# Patient Record
Sex: Female | Born: 1990 | Race: White | Hispanic: No | Marital: Married | Smoking: Former smoker
Health system: Southern US, Community
[De-identification: ages and names within clinical notes are randomized; demographics above are authoritative.]

## PROBLEM LIST (undated history)

## (undated) DIAGNOSIS — R42 Dizziness and giddiness: Secondary | ICD-10-CM

## (undated) DIAGNOSIS — J45909 Unspecified asthma, uncomplicated: Secondary | ICD-10-CM

## (undated) DIAGNOSIS — IMO0002 Reserved for concepts with insufficient information to code with codable children: Secondary | ICD-10-CM

## (undated) DIAGNOSIS — F32A Depression, unspecified: Secondary | ICD-10-CM

## (undated) DIAGNOSIS — G9050 Complex regional pain syndrome I, unspecified: Secondary | ICD-10-CM

## (undated) DIAGNOSIS — N83209 Unspecified ovarian cyst, unspecified side: Secondary | ICD-10-CM

## (undated) DIAGNOSIS — F419 Anxiety disorder, unspecified: Secondary | ICD-10-CM

## (undated) DIAGNOSIS — D649 Anemia, unspecified: Secondary | ICD-10-CM

## (undated) DIAGNOSIS — F319 Bipolar disorder, unspecified: Secondary | ICD-10-CM

## (undated) DIAGNOSIS — F329 Major depressive disorder, single episode, unspecified: Secondary | ICD-10-CM

## (undated) HISTORY — DX: Major depressive disorder, single episode, unspecified: F32.9

## (undated) HISTORY — DX: Depression, unspecified: F32.A

## (undated) HISTORY — PX: CHOLECYSTECTOMY: SHX55

## (undated) HISTORY — DX: Anxiety disorder, unspecified: F41.9

---

## 2012-10-26 ENCOUNTER — Emergency Department (HOSPITAL_BASED_OUTPATIENT_CLINIC_OR_DEPARTMENT_OTHER)
Admission: EM | Admit: 2012-10-26 | Discharge: 2012-10-26 | Disposition: A | Discharge status: Home / Self Care | Payer: 59 | Attending: Emergency Medicine | Admitting: Emergency Medicine

## 2012-10-26 ENCOUNTER — Encounter (HOSPITAL_BASED_OUTPATIENT_CLINIC_OR_DEPARTMENT_OTHER): Payer: Self-pay | Admitting: *Deleted

## 2012-10-26 ENCOUNTER — Emergency Department (HOSPITAL_BASED_OUTPATIENT_CLINIC_OR_DEPARTMENT_OTHER): Payer: 59

## 2012-10-26 DIAGNOSIS — R197 Diarrhea, unspecified: Secondary | ICD-10-CM | POA: Insufficient documentation

## 2012-10-26 DIAGNOSIS — J45909 Unspecified asthma, uncomplicated: Secondary | ICD-10-CM | POA: Insufficient documentation

## 2012-10-26 DIAGNOSIS — Z8742 Personal history of other diseases of the female genital tract: Secondary | ICD-10-CM | POA: Insufficient documentation

## 2012-10-26 DIAGNOSIS — F17200 Nicotine dependence, unspecified, uncomplicated: Secondary | ICD-10-CM | POA: Insufficient documentation

## 2012-10-26 DIAGNOSIS — Z79899 Other long term (current) drug therapy: Secondary | ICD-10-CM | POA: Insufficient documentation

## 2012-10-26 DIAGNOSIS — F319 Bipolar disorder, unspecified: Secondary | ICD-10-CM | POA: Insufficient documentation

## 2012-10-26 DIAGNOSIS — N949 Unspecified condition associated with female genital organs and menstrual cycle: Secondary | ICD-10-CM | POA: Insufficient documentation

## 2012-10-26 DIAGNOSIS — Z3202 Encounter for pregnancy test, result negative: Secondary | ICD-10-CM | POA: Insufficient documentation

## 2012-10-26 DIAGNOSIS — Z8669 Personal history of other diseases of the nervous system and sense organs: Secondary | ICD-10-CM | POA: Insufficient documentation

## 2012-10-26 DIAGNOSIS — R110 Nausea: Secondary | ICD-10-CM | POA: Insufficient documentation

## 2012-10-26 HISTORY — DX: Unspecified asthma, uncomplicated: J45.909

## 2012-10-26 HISTORY — DX: Bipolar disorder, unspecified: F31.9

## 2012-10-26 HISTORY — DX: Unspecified ovarian cyst, unspecified side: N83.209

## 2012-10-26 HISTORY — DX: Complex regional pain syndrome I, unspecified: G90.50

## 2012-10-26 LAB — WET PREP, GENITAL: Trich, Wet Prep: NONE SEEN

## 2012-10-26 LAB — URINALYSIS, ROUTINE W REFLEX MICROSCOPIC
Bilirubin Urine: NEGATIVE
Hgb urine dipstick: NEGATIVE
Ketones, ur: NEGATIVE mg/dL
Nitrite: NEGATIVE
pH: 7 (ref 5.0–8.0)

## 2012-10-26 MED ORDER — ONDANSETRON 4 MG PO TBDP: 4.0000 mg | ORAL_TABLET | Freq: Three times a day (TID) | ORAL | Status: DC | PRN

## 2012-10-26 MED ORDER — HYDROCODONE-ACETAMINOPHEN 5-325 MG PO TABS: 2.0000 | ORAL_TABLET | Freq: Four times a day (QID) | ORAL | Status: DC | PRN

## 2012-10-26 MED ORDER — ONDANSETRON 4 MG PO TBDP
4.0000 mg | ORAL_TABLET | Freq: Once | ORAL | Status: AC
  Administered 2012-10-26: 4 mg via ORAL
  Filled 2012-10-26: qty 1

## 2012-10-26 MED ORDER — HYDROCODONE-ACETAMINOPHEN 5-325 MG PO TABS
2.0000 | ORAL_TABLET | Freq: Once | ORAL | Status: AC
  Administered 2012-10-26: 2 via ORAL
  Filled 2012-10-26: qty 2

## 2012-10-26 NOTE — ED Notes (Signed)
Assisted EDP with pelvic exam 

## 2012-10-26 NOTE — ED Notes (Signed)
Pt c/o abd pain x 1 week- n/v/d x 1 week

## 2012-10-26 NOTE — ED Notes (Signed)
D/c home with ride- rx x 2 given for zofran and hydrocodone

## 2012-10-26 NOTE — ED Provider Notes (Signed)
History     CSN: [id number]  Arrival date & time 10/26/12  2137   First MD Initiated Contact with Patient 10/26/12 2152      Chief Complaint  Patient presents with  . Abdominal Pain    (Consider location/radiation/quality/duration/timing/severity/associated sxs/prior treatment) Patient is a 22 y.o. female presenting with abdominal pain.  Abdominal Pain  Pt with history of recurrent ovarian cysts reports about a week of severe aching R pelvic pain, worse with movement, associated with nausea and diarrhea. Denies dysuria, hematuria, vaginal bleeding or vaginal discharge. Does not think she is pregnant. Moved her to live with mother recently.   Past Medical History  Diagnosis Date  . Asthma   . RSD (reflex sympathetic dystrophy)   . Ovarian cyst   . Bipolar 1 disorder     Past Surgical History  Procedure Laterality Date  . Cholecystectomy      No family history on file.  History  Substance Use Topics  . Smoking status: Current Some Day Smoker  . Smokeless tobacco: Never Used  . Alcohol Use: No    OB History   Grav Para Term Preterm Abortions TAB SAB Ect Mult Living                  Review of Systems  Gastrointestinal: Positive for abdominal pain.   All other systems reviewed and are negative except as noted in HPI.   Allergies  Dilaudid; Percocet; and Subutex  Home Medications   Current Outpatient Rx  Name  Route  Sig  Dispense  Refill  . QUEtiapine Fumarate (SEROQUEL XR PO)   Oral   Take by mouth.           BP 130/75  Pulse 88  Temp(Src) 98.4 F (36.9 C)  Resp 18  SpO2 100%  LMP 10/06/2012  Physical Exam  Nursing note and vitals reviewed. Constitutional: She is oriented to person, place, and time. She appears well-developed and well-nourished.  HENT:  Head: Normocephalic and atraumatic.  Eyes: EOM are normal. Pupils are equal, round, and reactive to light.  Neck: Normal range of motion. Neck supple.  Cardiovascular: Normal rate, normal  heart sounds and intact distal pulses.   Pulmonary/Chest: Effort normal and breath sounds normal.  Abdominal: Bowel sounds are normal. She exhibits no distension. There is tenderness (R pelvis, no focal tenderness at McBurney's point). There is no rebound and no guarding.  Genitourinary:  No discharge, no bleeding, normal cervix without CMT, tenderness over R adnexa, no masses  Musculoskeletal: Normal range of motion. She exhibits no edema and no tenderness.  Neurological: She is alert and oriented to person, place, and time. She has normal strength. No cranial nerve deficit or sensory deficit.  Skin: Skin is warm and dry. No rash noted.  Psychiatric: She has a normal mood and affect.    ED Course  Procedures (including critical care time)  Labs Reviewed  WET PREP, GENITAL - Abnormal; Notable for the following:    Clue Cells Wet Prep HPF POC FEW (*)    WBC, Wet Prep HPF POC FEW (*)    All other components within normal limits  GC/CHLAMYDIA PROBE AMP  URINALYSIS, ROUTINE W REFLEX MICROSCOPIC  PREGNANCY, URINE   US Transvaginal Non-ob  10/26/2012  *RADIOLOGY REPORT*  Clinical Data:  Right lower quadrant and pelvic pain for 1 week with nausea, vomiting, diarrhea, past history ovarian cysts  TRANSABDOMINAL AND TRANSVAGINAL ULTRASOUND OF PELVIS DOPPLER ULTRASOUND OF OVARIES  Technique:  Both transabdominal and  transvaginal ultrasound examinations of the pelvis were performed. Transabdominal technique was performed for global imaging of the pelvis including uterus, ovaries, adnexal regions, and pelvic cul-de-sac. Transabdominal imaging limited by inadequate bladder distention and poor acoustic window as well as presence of numerous bowel loops.  It was necessary to proceed with endovaginal exam following the transabdominal exam to visualize the the endometrium, ovaries and adnexae.  Color and duplex Doppler ultrasound was utilized to evaluate blood flow to the ovaries.  Comparison:  None  Findings:   Uterus:  With 6.3 cm length by 4.4 cm AP by 5.1 cm transverse. Normal morphology without mass.  Endometrium:  9 mm thick, normal.  No endometrial fluid.  Right ovary: 3.6 x 2.9 x 3.0 cm.Dominant follicle.  No additional masses.  Internal blood flow present on color Doppler imaging.  Left ovary:   2.4 x 1.5 1.0 cm.  Normal morphology without mass. Internal blood flow present on color Doppler imaging.  Pulsed Doppler evaluation demonstrates normal low-resistance arterial and venous waveforms in both ovaries.  IMPRESSION: Normal exam. No evidence of pelvic mass or other significant abnormality. No sonographic evidence for ovarian torsion.   Original Report Authenticated By: Ulyses Southward, M.D.    US Pelvis Complete  10/26/2012  *RADIOLOGY REPORT*  Clinical Data:  Right lower quadrant and pelvic pain for 1 week with nausea, vomiting, diarrhea, past history ovarian cysts  TRANSABDOMINAL AND TRANSVAGINAL ULTRASOUND OF PELVIS DOPPLER ULTRASOUND OF OVARIES  Technique:  Both transabdominal and transvaginal ultrasound examinations of the pelvis were performed. Transabdominal technique was performed for global imaging of the pelvis including uterus, ovaries, adnexal regions, and pelvic cul-de-sac. Transabdominal imaging limited by inadequate bladder distention and poor acoustic window as well as presence of numerous bowel loops.  It was necessary to proceed with endovaginal exam following the transabdominal exam to visualize the the endometrium, ovaries and adnexae.  Color and duplex Doppler ultrasound was utilized to evaluate blood flow to the ovaries.  Comparison:  None  Findings:  Uterus:  With 6.3 cm length by 4.4 cm AP by 5.1 cm transverse. Normal morphology without mass.  Endometrium:  9 mm thick, normal.  No endometrial fluid.  Right ovary: 3.6 x 2.9 x 3.0 cm.Dominant follicle.  No additional masses.  Internal blood flow present on color Doppler imaging.  Left ovary:   2.4 x 1.5 1.0 cm.  Normal morphology without mass.  Internal blood flow present on color Doppler imaging.  Pulsed Doppler evaluation demonstrates normal low-resistance arterial and venous waveforms in both ovaries.  IMPRESSION: Normal exam. No evidence of pelvic mass or other significant abnormality. No sonographic evidence for ovarian torsion.   Original Report Authenticated By: Ulyses Southward, M.D.    [place] Art/ven Flow Abd Pelv Doppler  10/26/2012  *RADIOLOGY REPORT*  Clinical Data:  Right lower quadrant and pelvic pain for 1 week with nausea, vomiting, diarrhea, past history ovarian cysts  TRANSABDOMINAL AND TRANSVAGINAL ULTRASOUND OF PELVIS DOPPLER ULTRASOUND OF OVARIES  Technique:  Both transabdominal and transvaginal ultrasound examinations of the pelvis were performed. Transabdominal technique was performed for global imaging of the pelvis including uterus, ovaries, adnexal regions, and pelvic cul-de-sac. Transabdominal imaging limited by inadequate bladder distention and poor acoustic window as well as presence of numerous bowel loops.  It was necessary to proceed with endovaginal exam following the transabdominal exam to visualize the the endometrium, ovaries and adnexae.  Color and duplex Doppler ultrasound was utilized to evaluate blood flow to the ovaries.  Comparison:  None  Findings:  Uterus:  With 6.3 cm length by 4.4 cm AP by 5.1 cm transverse. Normal morphology without mass.  Endometrium:  9 mm thick, normal.  No endometrial fluid.  Right ovary: 3.6 x 2.9 x 3.0 cm.Dominant follicle.  No additional masses.  Internal blood flow present on color Doppler imaging.  Left ovary:   2.4 x 1.5 1.0 cm.  Normal morphology without mass. Internal blood flow present on color Doppler imaging.  Pulsed Doppler evaluation demonstrates normal low-resistance arterial and venous waveforms in both ovaries.  IMPRESSION: Normal exam. No evidence of pelvic mass or other significant abnormality. No sonographic evidence for ovarian torsion.   Original Report Authenticated By: Ulyses Southward, M.D.      No diagnosis found.    MDM  [place] as above neg for cyst, torsion or fluid collection. Pt with pelvic pain, no concern for acute appendicitis given location of pain and duration of symptoms. Advised pain and nausea meds as needed and close Gyn followup for recheck if not improved.         Charles B. Bernette Mayers, MD 10/26/12 2322

## 2012-10-27 LAB — GC/CHLAMYDIA PROBE AMP
CT Probe RNA: NEGATIVE
GC Probe RNA: NEGATIVE

## 2012-11-11 ENCOUNTER — Encounter: Payer: Self-pay | Admitting: Medical

## 2012-11-11 ENCOUNTER — Ambulatory Visit (INDEPENDENT_AMBULATORY_CARE_PROVIDER_SITE_OTHER): Payer: 59 | Admitting: Medical

## 2012-11-11 VITALS — BP 123/85 | HR 85 | Ht 67.0 in | Wt 131.5 lb

## 2012-11-11 DIAGNOSIS — N760 Acute vaginitis: Secondary | ICD-10-CM

## 2012-11-11 DIAGNOSIS — R112 Nausea with vomiting, unspecified: Secondary | ICD-10-CM

## 2012-11-11 DIAGNOSIS — A499 Bacterial infection, unspecified: Secondary | ICD-10-CM

## 2012-11-11 DIAGNOSIS — N946 Dysmenorrhea, unspecified: Secondary | ICD-10-CM

## 2012-11-11 DIAGNOSIS — J45909 Unspecified asthma, uncomplicated: Secondary | ICD-10-CM

## 2012-11-11 MED ORDER — ALBUTEROL SULFATE HFA 108 (90 BASE) MCG/ACT IN AERS: 2.0000 | INHALATION_SPRAY | Freq: Four times a day (QID) | RESPIRATORY_TRACT | Status: DC | PRN

## 2012-11-11 MED ORDER — METRONIDAZOLE 500 MG PO TABS: 500.0000 mg | ORAL_TABLET | Freq: Two times a day (BID) | ORAL | Status: DC

## 2012-11-11 MED ORDER — ONDANSETRON 4 MG PO TBDP: 4.0000 mg | ORAL_TABLET | Freq: Three times a day (TID) | ORAL | Status: DC | PRN

## 2012-11-11 NOTE — Progress Notes (Signed)
Patient ID: Elizabeth Little, female   DOB: 1991/05/26, 22 y.o.   MRN: [id number]  History:  Ms. Elizabeth Little  is a 22 y.o. G0P0000 who presents to clinic today for ED follow-up for lower abdominal pain. RLQ pain x 3+ weeks. N/V/D for the same period of time. History of ovarian cysts. LMP 10/28/12. Pain has been the same since ED visit. No solid foods. Ran out of nausea medicine over one week ago. Was working when she was taking it. History of IBS. Does not have PCP or GI here yet. Also has asthma and needs Rx for albuterol to last until PCP is established. History of dysmenorrhea. Not currently on any type of birth control. Had Mirena that had to be removed for malplacement surgically. Has also had cholecystectomy. Patient is sexually active and uses condoms.   The following portions of the patient's history were reviewed and updated as appropriate: allergies, current medications, past family history, past medical history, past social history, past surgical history and problem list.  Review of Systems:  Pertinent items are noted in HPI.  Objective:  Physical Exam BP 123/85  Pulse 85  Ht 5\' 7"  (1.702 m)  Wt 131 lb 8 oz (59.648 kg)  BMI 20.59 kg/m2  LMP 11/03/2012 GENERAL: Well-developed, well-nourished female in no acute distress.  HEENT: Normocephalic, atraumatic.  LUNGS: Normal rate. Clear to auscultation bilaterally.  HEART: Regular rate and rhythm with no adventitious sounds.  ABDOMEN: Soft, mild tenderness to palpation of the suprapubic region at midline and just right of midline, nondistended. No organomegaly. Normal bowel sounds appreciated in all quadrants.  EXTREMITIES: No cyanosis, clubbing, or edema.   Labs and Imaging US Transvaginal Non-ob  10/26/2012  *RADIOLOGY REPORT*  Clinical Data:  Right lower quadrant and pelvic pain for 1 week with nausea, vomiting, diarrhea, past history ovarian cysts  TRANSABDOMINAL AND TRANSVAGINAL ULTRASOUND OF PELVIS DOPPLER ULTRASOUND OF OVARIES   Technique:  Both transabdominal and transvaginal ultrasound examinations of the pelvis were performed. Transabdominal technique was performed for global imaging of the pelvis including uterus, ovaries, adnexal regions, and pelvic cul-de-sac. Transabdominal imaging limited by inadequate bladder distention and poor acoustic window as well as presence of numerous bowel loops.  It was necessary to proceed with endovaginal exam following the transabdominal exam to visualize the the endometrium, ovaries and adnexae.  Color and duplex Doppler ultrasound was utilized to evaluate blood flow to the ovaries.  Comparison:  None  Findings:  Uterus:  With 6.3 cm length by 4.4 cm AP by 5.1 cm transverse. Normal morphology without mass.  Endometrium:  9 mm thick, normal.  No endometrial fluid.  Right ovary: 3.6 x 2.9 x 3.0 cm.Dominant follicle.  No additional masses.  Internal blood flow present on color Doppler imaging.  Left ovary:   2.4 x 1.5 1.0 cm.  Normal morphology without mass. Internal blood flow present on color Doppler imaging.  Pulsed Doppler evaluation demonstrates normal low-resistance arterial and venous waveforms in both ovaries.  IMPRESSION: Normal exam. No evidence of pelvic mass or other significant abnormality. No sonographic evidence for ovarian torsion.   Original Report Authenticated By: Ulyses Southward, M.D.    10/26/12 - GC/Chlamydia negative 10/26/12 - Few clue cells seen on wet prep - untreated at time  Assessment & Plan:  Assessment: 1. Asthma, chronic 2. Dysmenorrhea 3. Bacterial vaginosis 4. Birth control counseling  Plans: Rx refill x 1 for albuterol, until patient is able to establish care with PCP Rx for Flagyl and Zofran sent to  patient's pharmacy PCP referral today from [facility]. Patient may need GI consult if symptoms persist.  All methods of birth control discussed. Patient interested in Nexplanon vs. Depo Provera. Information on both given on AVS. Patient will contact the office when  she has decided which method she prefers Patient needs annual exam at next visit or following Nexplanon insertion

## 2012-11-11 NOTE — Patient Instructions (Signed)
Etonogestrel implant What is this medicine? ETONOGESTREL is a contraceptive (birth control) device. It is used to prevent pregnancy. It can be used for up to 3 years. This medicine may be used for other purposes; ask your health care provider or pharmacist if you have questions. What should I tell my health care provider before I take this medicine? They need to know if you have any of these conditions: -abnormal vaginal bleeding -blood vessel disease or blood clots -cancer of the breast, cervix, or liver -depression -diabetes -gallbladder disease -headaches -heart disease or recent heart attack -high blood pressure -high cholesterol -kidney disease -liver disease -renal disease -seizures -tobacco smoker -an unusual or allergic reaction to etonogestrel, other hormones, anesthetics or antiseptics, medicines, foods, dyes, or preservatives -pregnant or trying to get pregnant -breast-feeding How should I use this medicine? This device is inserted just under the skin on the inner side of your upper arm by a health care professional. Talk to your pediatrician regarding the use of this medicine in children. Special care may be needed. Overdosage: If you think you've taken too much of this medicine contact a poison control center or emergency room at once. Overdosage: If you think you have taken too much of this medicine contact a poison control center or emergency room at once. NOTE: This medicine is only for you. Do not share this medicine with others. What if I miss a dose? This does not apply. What may interact with this medicine? Do not take this medicine with any of the following medications: -amprenavir -bosentan -fosamprenavir This medicine may also interact with the following medications: -barbiturate medicines for inducing sleep or treating seizures -certain medicines for fungal infections like ketoconazole and itraconazole -griseofulvin -medicines to treat seizures like  carbamazepine, felbamate, oxcarbazepine, phenytoin, topiramate -modafinil -phenylbutazone -rifampin -some medicines to treat HIV infection like atazanavir, indinavir, lopinavir, nelfinavir, tipranavir, ritonavir -St. John's wort This list may not describe all possible interactions. Give your health care provider a list of all the medicines, herbs, non-prescription drugs, or dietary supplements you use. Also tell them if you smoke, drink alcohol, or use illegal drugs. Some items may interact with your medicine. What should I watch for while using this medicine? This product does not protect you against HIV infection (AIDS) or other sexually transmitted diseases. You should be able to feel the implant by pressing your fingertips over the skin where it was inserted. Tell your doctor if you cannot feel the implant. What side effects may I notice from receiving this medicine? Side effects that you should report to your doctor or health care professional as soon as possible: -allergic reactions like skin rash, itching or hives, swelling of the face, lips, or tongue -breast lumps -changes in vision -confusion, trouble speaking or understanding -dark urine -depressed mood -general ill feeling or flu-like symptoms -light-colored stools -loss of appetite, nausea -right upper belly pain -severe headaches -severe pain, swelling, or tenderness in the abdomen -shortness of breath, chest pain, swelling in a leg -signs of pregnancy -sudden numbness or weakness of the face, arm or leg -trouble walking, dizziness, loss of balance or coordination -unusual vaginal bleeding, discharge -unusually weak or tired -yellowing of the eyes or skin Side effects that usually do not require medical attention (Report these to your doctor or health care professional if they continue or are bothersome.): -acne -breast pain -changes in weight -cough -fever or chills -headache -irregular menstrual  bleeding -itching, burning, and vaginal discharge -pain or difficulty passing urine -sore throat This   list may not describe all possible side effects. Call your doctor for medical advice about side effects. You may report side effects to FDA at 1-800-FDA-1088. Where should I keep my medicine? This drug is given in a hospital or clinic and will not be stored at home. NOTE: This sheet is a summary. It may not cover all possible information. If you have questions about this medicine, talk to your doctor, pharmacist, or health care provider.  2013, [employer]vier/Gold Standard. (04/27/2009 3:54:17 PM)  Medroxyprogesterone injection [Contraceptive] What is this medicine? MEDROXYPROGESTERONE (me DROX ee proe JES te rone) contraceptive injections prevent pregnancy. They provide effective birth control for 3 months. Depo-subQ Provera 104 is also used for treating pain related to endometriosis. This medicine may be used for other purposes; ask your health care provider or pharmacist if you have questions. What should I tell my health care provider before I take this medicine? They need to know if you have any of these conditions: -frequently drink alcohol -asthma -blood vessel disease or a history of a blood clot in the lungs or legs -bone disease such as osteoporosis -breast cancer -diabetes -eating disorder (anorexia nervosa or bulimia) -high blood pressure -HIV infection or AIDS -kidney disease -liver disease -mental depression -migraine -seizures (convulsions) -stroke -tobacco smoker -vaginal bleeding -an unusual or allergic reaction to medroxyprogesterone, other hormones, medicines, foods, dyes, or preservatives -pregnant or trying to get pregnant -breast-feeding How should I use this medicine? Depo-Provera Contraceptive injection is given into a muscle. Depo-subQ Provera 104 injection is given under the skin. These injections are given by a health care professional. You must not be  pregnant before getting an injection. The injection is usually given during the first 5 days after the start of a menstrual period or 6 weeks after delivery of a baby. Talk to your pediatrician regarding the use of this medicine in children. Special care may be needed. These injections have been used in female children who have started having menstrual periods. Overdosage: If you think you have taken too much of this medicine contact a poison control center or emergency room at once. NOTE: This medicine is only for you. Do not share this medicine with others. What if I miss a dose? Try not to miss a dose. You must get an injection once every 3 months to maintain birth control. If you cannot keep an appointment, call and reschedule it. If you wait longer than 13 weeks between Depo-Provera contraceptive injections or longer than 14 weeks between Depo-subQ Provera 104 injections, you could get pregnant. Use another method for birth control if you miss your appointment. You may also need a pregnancy test before receiving another injection. What may interact with this medicine? Do not take this medicine with any of the following medications: -bosentan This medicine may also interact with the following medications: -aminoglutethimide -antibiotics or medicines for infections, especially rifampin, rifabutin, rifapentine, and griseofulvin -aprepitant -barbiturate medicines such as phenobarbital or primidone -bexarotene -carbamazepine -medicines for seizures like ethotoin, felbamate, oxcarbazepine, phenytoin, topiramate -modafinil -St. John's wort This list may not describe all possible interactions. Give your health care provider a list of all the medicines, herbs, non-prescription drugs, or dietary supplements you use. Also tell them if you smoke, drink alcohol, or use illegal drugs. Some items may interact with your medicine. What should I watch for while using this medicine? This drug does not protect  you against HIV infection (AIDS) or other sexually transmitted diseases. Use of this product may cause you to lose  calcium from your bones. Loss of calcium may cause weak bones (osteoporosis). Only use this product for more than 2 years if other forms of birth control are not right for you. The longer you use this product for birth control the more likely you will be at risk for weak bones. Ask your health care professional how you can keep strong bones. You may have a change in bleeding pattern or irregular periods. Many females stop having periods while taking this drug. If you have received your injections on time, your chance of being pregnant is very low. If you think you may be pregnant, see your health care professional as soon as possible. Tell your health care professional if you want to get pregnant within the next year. The effect of this medicine may last a long time after you get your last injection. What side effects may I notice from receiving this medicine? Side effects that you should report to your doctor or health care professional as soon as possible: -allergic reactions like skin rash, itching or hives, swelling of the face, lips, or tongue -breast tenderness or discharge -breathing problems -changes in vision -depression -feeling faint or lightheaded, falls -fever -pain in the abdomen, chest, groin, or leg -problems with balance, talking, walking -unusually weak or tired -yellowing of the eyes or skin Side effects that usually do not require medical attention (report to your doctor or health care professional if they continue or are bothersome): -acne -fluid retention and swelling -headache -irregular periods, spotting, or absent periods -temporary pain, itching, or skin reaction at site where injected -weight gain This list may not describe all possible side effects. Call your doctor for medical advice about side effects. You may report side effects to FDA at  1-800-FDA-1088. Where should I keep my medicine? This does not apply. The injection will be given to you by a health care professional. NOTE: This sheet is a summary. It may not cover all possible information. If you have questions about this medicine, talk to your doctor, pharmacist, or health care provider.  2013, [employer]vier/Gold Standard. (08/25/2008 6:37:56 PM)

## 2012-11-17 ENCOUNTER — Telehealth: Payer: Self-pay | Admitting: *Deleted

## 2012-11-17 MED ORDER — ONDANSETRON HCL 4 MG PO TABS: 4.0000 mg | ORAL_TABLET | Freq: Three times a day (TID) | ORAL | Status: DC | PRN

## 2012-11-17 NOTE — Telephone Encounter (Signed)
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 She is requesting med to be re-ordered as po tablet. Received authorization from Dr. Thad Ranger to re-order med. Message left for pt that she may pick up new med today.

## 2013-03-07 ENCOUNTER — Ambulatory Visit (INDEPENDENT_AMBULATORY_CARE_PROVIDER_SITE_OTHER): Payer: 59 | Admitting: Obstetrics and Gynecology

## 2013-03-07 ENCOUNTER — Encounter: Payer: Self-pay | Admitting: Obstetrics and Gynecology

## 2013-03-07 VITALS — BP 122/78 | HR 73 | Temp 99.7°F | Ht 67.0 in | Wt 135.0 lb

## 2013-03-07 DIAGNOSIS — Z01812 Encounter for preprocedural laboratory examination: Secondary | ICD-10-CM

## 2013-03-07 DIAGNOSIS — Z30017 Encounter for initial prescription of implantable subdermal contraceptive: Secondary | ICD-10-CM

## 2013-03-07 MED ORDER — ETONOGESTREL 68 MG ~~LOC~~ IMPL
68.0000 mg | DRUG_IMPLANT | Freq: Once | SUBCUTANEOUS | Status: AC
  Administered 2013-03-07: 68 mg via SUBCUTANEOUS

## 2013-03-07 NOTE — Progress Notes (Signed)
Patient ID: Elizabeth Little, female   DOB: [birth year], 22 y.o.   MRN: [id number] Patient presenting today for Nexplanon insertion.  Patient given informed consent, signed copy in the chart, time out was performed. Pregnancy test was negative. Appropriate time out taken.  Patient's left arm was prepped and draped in the usual sterile fashion.. The ruler used to measure and mark insertion area.  Pt was prepped with alcohol swab and then injected with 1 cc of 1% lidocaine with epinephrine.  Pt was prepped with betadine, Implanon removed form packaging,  Device confirmed in needle, then inserted full length of needle and withdrawn per handbook instructions.  Pt insertion site covered with pressure dressing.   Minimal blood loss.  Pt tolerated the procedure well.

## 2013-03-07 NOTE — Patient Instructions (Signed)

## 2013-03-29 ENCOUNTER — Encounter: Payer: Self-pay | Admitting: *Deleted

## 2014-07-14 ENCOUNTER — Ambulatory Visit (INDEPENDENT_AMBULATORY_CARE_PROVIDER_SITE_OTHER): Payer: 59 | Admitting: Emergency Medicine

## 2014-07-14 VITALS — BP 120/82 | HR 90 | Temp 98.6°F | Ht 65.0 in | Wt 158.8 lb

## 2014-07-14 DIAGNOSIS — Z79899 Other long term (current) drug therapy: Secondary | ICD-10-CM

## 2014-07-14 LAB — COMPREHENSIVE METABOLIC PANEL
ALK PHOS: 174 U/L — AB (ref 39–117)
ALT: 64 U/L — ABNORMAL HIGH (ref 0–35)
AST: 48 U/L — ABNORMAL HIGH (ref 0–37)
Albumin: 4.4 g/dL (ref 3.5–5.2)
BILIRUBIN TOTAL: 0.3 mg/dL (ref 0.2–1.2)
BUN: 13 mg/dL (ref 6–23)
CO2: 26 mEq/L (ref 19–32)
Calcium: 9.7 mg/dL (ref 8.4–10.5)
Chloride: 103 mEq/L (ref 96–112)
Creat: 0.86 mg/dL (ref 0.50–1.10)
GLUCOSE: 88 mg/dL (ref 70–99)
Potassium: 4.3 mEq/L (ref 3.5–5.3)
Sodium: 137 mEq/L (ref 135–145)
Total Protein: 7.4 g/dL (ref 6.0–8.3)

## 2014-07-14 LAB — LIPID PANEL
CHOL/HDL RATIO: 2.9 ratio
CHOLESTEROL: 206 mg/dL — AB (ref 0–200)
HDL: 72 mg/dL (ref >39)
LDL Cholesterol: 121 mg/dL — ABNORMAL HIGH (ref 0–99)
Triglycerides: 67 mg/dL (ref <150)
VLDL: 13 mg/dL (ref 0–40)

## 2014-07-14 NOTE — Progress Notes (Signed)
[facility] [address], [place] [address] [phone]  Date:  07/14/2014   Name:  Elizabeth Little   DOB:  Jan 13, 1991   MRN:  [id number]  PCP:  No PCP Per Patient    Chief Complaint: Follow-up   History of Present Illness:  Elizabeth Little is a 23 y.o. very pleasant female patient who presents with the following:  Patient with a history of bipolar disorder.  Put on seroquel and needs labs drawn to evaluate Tolerating medication well with no adverse effects Denies other complaint or health concern today.   Patient Active Problem List   Diagnosis Date Noted  . Asthma 11/11/2012  . Dysmenorrhea 11/11/2012    Past Medical History  Diagnosis Date  . Asthma   . RSD (reflex sympathetic dystrophy)   . Ovarian cyst   . Bipolar 1 disorder     Past Surgical History  Procedure Laterality Date  . Cholecystectomy      History  Substance Use Topics  . Smoking status: Former Games developermoker  . Smokeless tobacco: Never Used  . Alcohol Use: No    History reviewed. No pertinent family history.  Allergies  Allergen Reactions  . Dilaudid [Hydromorphone Hcl]   . Percocet [Oxycodone-Acetaminophen]   . Subutex [Buprenorphine]     Medication list has been reviewed and updated.  Current Outpatient Prescriptions on File Prior to Visit  Medication Sig Dispense Refill  . QUEtiapine Fumarate (SEROQUEL XR PO) Take by mouth.    Marland Kitchen. albuterol (PROVENTIL HFA;VENTOLIN HFA) 108 (90 BASE) MCG/ACT inhaler Inhale 2 puffs into the lungs every 6 (six) hours as needed for wheezing. (Patient not taking: Reported on 07/14/2014) 1 Inhaler 0  . HYDROcodone-acetaminophen (NORCO/VICODIN) 5-325 MG per tablet Take 2 tablets by mouth every 6 (six) hours as needed for pain. (Patient not taking: Reported on 07/14/2014) 30 tablet 0   No current facility-administered medications on file prior to visit.    Review of Systems:  As per HPI, otherwise negative.    Physical  Examination: Filed Vitals:   07/14/14 1523  BP: 120/82  Pulse: 90  Temp: 98.6 F (37 C)   Filed Vitals:   07/14/14 1523  Height: 5\' 5"  (1.651 m)  Weight: 158 lb 12.8 oz (72.031 kg)   Body mass index is 26.43 kg/(m^2). Ideal Body Weight: Weight in (lb) to have BMI = 25: 149.9   GEN: WDWN, NAD, Non-toxic, Alert & Oriented x 3 HEENT: Atraumatic, Normocephalic.  Ears and Nose: No external deformity. EXTR: No clubbing/cyanosis/edema NEURO: Normal gait.  PSYCH: Normally interactive. Conversant. Not depressed or anxious appearing.  Calm demeanor.    Assessment and Plan: Bipolar disorder  Labs pending  Signed,  Phillips OdorJeffery Reshad Saab, MD

## 2014-07-24 ENCOUNTER — Emergency Department (HOSPITAL_COMMUNITY)
Admission: EM | Admit: 2014-07-24 | Discharge: 2014-07-25 | Disposition: A | Discharge status: Other Acute Inpatient Hospital | Payer: 59 | Attending: Emergency Medicine | Admitting: Emergency Medicine

## 2014-07-24 ENCOUNTER — Encounter (HOSPITAL_COMMUNITY): Payer: Self-pay | Admitting: Emergency Medicine

## 2014-07-24 DIAGNOSIS — Y9289 Other specified places as the place of occurrence of the external cause: Secondary | ICD-10-CM | POA: Insufficient documentation

## 2014-07-24 DIAGNOSIS — J45909 Unspecified asthma, uncomplicated: Secondary | ICD-10-CM | POA: Insufficient documentation

## 2014-07-24 DIAGNOSIS — Y998 Other external cause status: Secondary | ICD-10-CM | POA: Insufficient documentation

## 2014-07-24 DIAGNOSIS — T1491XA Suicide attempt, initial encounter: Secondary | ICD-10-CM

## 2014-07-24 DIAGNOSIS — T398X2A Poisoning by other nonopioid analgesics and antipyretics, not elsewhere classified, intentional self-harm, initial encounter: Secondary | ICD-10-CM | POA: Insufficient documentation

## 2014-07-24 DIAGNOSIS — Z8669 Personal history of other diseases of the nervous system and sense organs: Secondary | ICD-10-CM | POA: Insufficient documentation

## 2014-07-24 DIAGNOSIS — Y9389 Activity, other specified: Secondary | ICD-10-CM | POA: Diagnosis not present

## 2014-07-24 DIAGNOSIS — Z87891 Personal history of nicotine dependence: Secondary | ICD-10-CM | POA: Diagnosis not present

## 2014-07-24 DIAGNOSIS — Z79899 Other long term (current) drug therapy: Secondary | ICD-10-CM | POA: Diagnosis not present

## 2014-07-24 DIAGNOSIS — F3164 Bipolar disorder, current episode mixed, severe, with psychotic features: Secondary | ICD-10-CM | POA: Diagnosis present

## 2014-07-24 DIAGNOSIS — R000 Tachycardia, unspecified: Secondary | ICD-10-CM | POA: Insufficient documentation

## 2014-07-24 DIAGNOSIS — F1210 Cannabis abuse, uncomplicated: Secondary | ICD-10-CM | POA: Diagnosis not present

## 2014-07-24 DIAGNOSIS — F32A Depression, unspecified: Secondary | ICD-10-CM

## 2014-07-24 DIAGNOSIS — Z8742 Personal history of other diseases of the female genital tract: Secondary | ICD-10-CM | POA: Insufficient documentation

## 2014-07-24 DIAGNOSIS — T50902A Poisoning by unspecified drugs, medicaments and biological substances, intentional self-harm, initial encounter: Secondary | ICD-10-CM

## 2014-07-24 DIAGNOSIS — Z3202 Encounter for pregnancy test, result negative: Secondary | ICD-10-CM | POA: Insufficient documentation

## 2014-07-24 DIAGNOSIS — F419 Anxiety disorder, unspecified: Secondary | ICD-10-CM | POA: Insufficient documentation

## 2014-07-24 DIAGNOSIS — F329 Major depressive disorder, single episode, unspecified: Secondary | ICD-10-CM

## 2014-07-24 DIAGNOSIS — T43592A Poisoning by other antipsychotics and neuroleptics, intentional self-harm, initial encounter: Secondary | ICD-10-CM | POA: Insufficient documentation

## 2014-07-24 LAB — ACETAMINOPHEN LEVEL
Acetaminophen (Tylenol), Serum: 58.7 ug/mL — ABNORMAL HIGH (ref 10–30)
Acetaminophen (Tylenol), Serum: 19.9 ug/mL (ref 10–30)

## 2014-07-24 LAB — COMPREHENSIVE METABOLIC PANEL
ALT: 41 U/L — AB (ref 0–35)
AST: 25 U/L (ref 0–37)
Albumin: 4 g/dL (ref 3.5–5.2)
Alkaline Phosphatase: 148 U/L — ABNORMAL HIGH (ref 39–117)
Anion gap: 17 — ABNORMAL HIGH (ref 5–15)
BUN: 14 mg/dL (ref 6–23)
CALCIUM: 9.3 mg/dL (ref 8.4–10.5)
CO2: 19 meq/L (ref 19–32)
CREATININE: 0.68 mg/dL (ref 0.50–1.10)
Chloride: 103 mEq/L (ref 96–112)
GLUCOSE: 123 mg/dL — AB (ref 70–99)
Potassium: 3.4 mEq/L — ABNORMAL LOW (ref 3.7–5.3)
Sodium: 139 mEq/L (ref 137–147)
Total Bilirubin: 0.3 mg/dL (ref 0.3–1.2)
Total Protein: 7.7 g/dL (ref 6.0–8.3)

## 2014-07-24 LAB — CBC WITH DIFFERENTIAL/PLATELET
Basophils Absolute: 0 10*3/uL (ref 0.0–0.1)
Basophils Relative: 0 % (ref 0–1)
EOS ABS: 0 10*3/uL (ref 0.0–0.7)
Eosinophils Relative: 1 % (ref 0–5)
HCT: 38.6 % (ref 36.0–46.0)
Hemoglobin: 12.2 g/dL (ref 12.0–15.0)
LYMPHS ABS: 2 10*3/uL (ref 0.7–4.0)
Lymphocytes Relative: 35 % (ref 12–46)
MCH: 27.1 pg (ref 26.0–34.0)
MCHC: 31.6 g/dL (ref 30.0–36.0)
MCV: 85.8 fL (ref 78.0–100.0)
Monocytes Absolute: 0.3 10*3/uL (ref 0.1–1.0)
Monocytes Relative: 6 % (ref 3–12)
NEUTROS PCT: 58 % (ref 43–77)
Neutro Abs: 3.2 10*3/uL (ref 1.7–7.7)
PLATELETS: 285 10*3/uL (ref 150–400)
RBC: 4.5 MIL/uL (ref 3.87–5.11)
RDW: 13.5 % (ref 11.5–15.5)
WBC: 5.5 10*3/uL (ref 4.0–10.5)

## 2014-07-24 LAB — RAPID URINE DRUG SCREEN, HOSP PERFORMED
AMPHETAMINES: NOT DETECTED
BENZODIAZEPINES: NOT DETECTED
Barbiturates: NOT DETECTED
COCAINE: NOT DETECTED
OPIATES: NOT DETECTED
Tetrahydrocannabinol: POSITIVE — AB

## 2014-07-24 LAB — URINALYSIS, ROUTINE W REFLEX MICROSCOPIC
Bilirubin Urine: NEGATIVE
GLUCOSE, UA: NEGATIVE mg/dL
Hgb urine dipstick: NEGATIVE
KETONES UR: NEGATIVE mg/dL
Leukocytes, UA: NEGATIVE
NITRITE: NEGATIVE
Protein, ur: NEGATIVE mg/dL
Specific Gravity, Urine: 1.015 (ref 1.005–1.030)
Urobilinogen, UA: 0.2 mg/dL (ref 0.0–1.0)
pH: 5.5 (ref 5.0–8.0)

## 2014-07-24 LAB — SALICYLATE LEVEL
SALICYLATE LVL: 11.9 mg/dL (ref 2.8–20.0)
Salicylate Lvl: 18.3 mg/dL (ref 2.8–20.0)

## 2014-07-24 LAB — POC URINE PREG, ED: PREG TEST UR: NEGATIVE

## 2014-07-24 LAB — ETHANOL: Alcohol, Ethyl (B): <11 mg/dL (ref 0–11)

## 2014-07-24 LAB — MAGNESIUM: MAGNESIUM: 1.7 mg/dL (ref 1.5–2.5)

## 2014-07-24 MED ORDER — SODIUM CHLORIDE 0.9 % IV BOLUS (SEPSIS)
1000.0000 mL | Freq: Once | INTRAVENOUS | Status: AC
  Administered 2014-07-24: 1000 mL via INTRAVENOUS

## 2014-07-24 MED ORDER — SODIUM CHLORIDE 0.9 % IV BOLUS (SEPSIS)
1000.0000 mL | Freq: Once | INTRAVENOUS | Status: AC
Start: 2014-07-24 — End: 2014-07-24
  Administered 2014-07-24: 1000 mL via INTRAVENOUS

## 2014-07-24 MED ORDER — LORAZEPAM 1 MG PO TABS
1.0000 mg | ORAL_TABLET | Freq: Three times a day (TID) | ORAL | Status: DC | PRN
  Administered 2014-07-24: 1 mg via ORAL
  Filled 2014-07-24: qty 1

## 2014-07-24 MED ORDER — ONDANSETRON HCL 4 MG PO TABS
4.0000 mg | ORAL_TABLET | Freq: Three times a day (TID) | ORAL | Status: DC | PRN
  Administered 2014-07-24: 4 mg via ORAL
  Filled 2014-07-24: qty 1

## 2014-07-24 MED ORDER — ALUM & MAG HYDROXIDE-SIMETH 200-200-20 MG/5ML PO SUSP: 30.0000 mL | ORAL | Status: DC | PRN

## 2014-07-24 NOTE — ED Notes (Signed)
Pt reports that she took the medication because she did not want to feel anything anymore. When asked if pt was feeling suicidal when she took these medications, she reported yes.

## 2014-07-24 NOTE — ED Provider Notes (Signed)
CSN: [id number]     Arrival date & time 07/24/14  1454 History   First MD Initiated Contact with Patient 07/24/14 1511     Chief Complaint  Patient presents with  . Ingestion     (Consider location/radiation/quality/duration/timing/severity/associated sxs/prior Treatment) HPI   Patient comes to the ED brought in by EMS and followed by [employer] for suicidal attempt by overdose. Patient says she took multiple handfuls of Excedrin PM and 2000 mg of Seroquel at 2:15 pm today. She did this in attempt to kill herself because she is tired of being in emotional pain. She reports that people or someone tells her she is a bad person. She has history of Bipolar disorder and takes Seroquel XR.  She is awake and alert but drowsy. She reports feeling sick, nauseous and dizzy currently. She denies taking any other substances or doing anything else to harm herself. She called someone and told them she needed help and this person called EMS. The patient currently is tachycardic but maintaining her aireway and responds to verbal stimuli.She is tearful. Denies HI, hallucinations or substance abuse.  Past Medical History  Diagnosis Date  . Asthma   . RSD (reflex sympathetic dystrophy)   . Ovarian cyst   . Bipolar 1 disorder    Past Surgical History  Procedure Laterality Date  . Cholecystectomy     History reviewed. No pertinent family history. History  Substance Use Topics  . Smoking status: Former Games developermoker  . Smokeless tobacco: Never Used  . Alcohol Use: No   OB History    Gravida Para Term Preterm AB TAB SAB Ectopic Multiple Living   0 0 0 0 0 0 0 0 0 0      Review of Systems 10 Systems reviewed and are negative for acute change except as noted in the HPI.   Allergies  Dilaudid; Percocet; and Subutex  Home Medications   Prior to Admission medications   Medication Sig Start Date End Date Taking? Authorizing Provider  Aspirin-Acetaminophen-Caffeine (EXCEDRIN PO) Take 1-2 tablets by mouth as needed  (headache/ migraine).   Yes Historical Provider, MD  QUEtiapine (SEROQUEL XR) 400 MG 24 hr tablet Take 400 mg by mouth daily.   Yes Historical Provider, MD  albuterol (PROVENTIL HFA;VENTOLIN HFA) 108 (90 BASE) MCG/ACT inhaler Inhale 2 puffs into the lungs every 6 (six) hours as needed for wheezing. Patient not taking: Reported on 07/24/2014 11/11/12   Marny LowensteinJulie N Wenzel, PA-C  HYDROcodone-acetaminophen (NORCO/VICODIN) 5-325 MG per tablet Take 2 tablets by mouth every 6 (six) hours as needed for pain. Patient not taking: Reported on 07/24/2014 10/26/12   Charles B. Bernette MayersSheldon, MD   BP 100/58 mmHg  Pulse 81  Temp(Src) 98.7 F (37.1 C) (Rectal)  Resp 21  SpO2 98%  LMP 07/04/2014 (LMP Unknown) Physical Exam  Constitutional: She appears well-developed and well-nourished. She appears distressed.  HENT:  Head: Normocephalic and atraumatic. Head is without raccoon's eyes, without Battle's sign, without abrasion and without contusion.  Right Ear: Tympanic membrane and ear canal normal.  Left Ear: Tympanic membrane and ear canal normal.  Nose: Nose normal.  Mouth/Throat: Uvula is midline and oropharynx is clear and moist.  Airway is patent. No signs of bleeding,injury or swelling.  Eyes: Conjunctivae are normal. Pupils are equal, round, and reactive to light.  Neck: Normal range of motion. Neck supple. No spinous process tenderness and no muscular tenderness present.  Cardiovascular: Normal rate and regular rhythm.   Pulmonary/Chest: Effort normal.  Abdominal: Soft. Bowel sounds  are normal. She exhibits no distension. There is no tenderness. There is no rigidity and no guarding.  Musculoskeletal:  FROM of all 4 extremities.  Neurological: She is alert.  Pt is too drowsy to participate in neuro exam.She does wake and respond to verbal stimuli on initial exam.  Skin: Skin is warm and dry.  No new rash to skin. No lacerations, contusions or obvious deformities appreciated.  Psychiatric: Her mood appears  anxious. Her speech is slurred. She is not actively hallucinating. She exhibits a depressed mood. She expresses suicidal ideation. She expresses no homicidal ideation. She expresses suicidal plans. She expresses no homicidal plans.  Nursing note and vitals reviewed.   ED Course  Procedures (including critical care time) Labs Review Labs Reviewed  ACETAMINOPHEN LEVEL - Abnormal; Notable for the following:    Acetaminophen (Tylenol), Serum 58.7 (*)    All other components within normal limits  URINE RAPID DRUG SCREEN (HOSP PERFORMED) - Abnormal; Notable for the following:    Tetrahydrocannabinol POSITIVE (*)    All other components within normal limits  COMPREHENSIVE METABOLIC PANEL - Abnormal; Notable for the following:    Potassium 3.4 (*)    Glucose, Bld 123 (*)    ALT 41 (*)    Alkaline Phosphatase 148 (*)    Anion gap 17 (*)    All other components within normal limits  SALICYLATE LEVEL  ETHANOL  CBC WITH DIFFERENTIAL  MAGNESIUM  URINALYSIS, ROUTINE W REFLEX MICROSCOPIC  ACETAMINOPHEN LEVEL  SALICYLATE LEVEL  POC URINE PREG, ED    Imaging Review No results found.   EKG Interpretation None      MDM   Final diagnoses:  Suicide attempt  Depression  Overdose, intentional self-harm, initial encounter   3: 30pm On initial exam the patient is tachycardic at 130's, drowsy but easily arousable to verbal stimuli. She continues to endorse SI. She reports feeling dizzy and nauseated.  4:15pm Parents have taken out IVC paperwork. Pts pulse is now 103. She is resting, easily can be waken up by verbal stimuli. She denies taking any other substances to harm herself at this time  5:12pm Family members are present. Dr. Lynelle DoctorKnapp, J. Shown EKG and lab results. Made aware of patient status. He says he will check on patient as well.  6:00pm Patient is awake and eating. Continues to be tachycardic at 120's.  8:49 pm Salicylate and acetaminophen are within normal limits at 4 hr  check.  07/24/14 1945  --  102  20  110/72 mmHg  100 %   10:00 pm Patient medically cleared. Vital signs are stable.TTS consult and holding orders placed. Will hold off on home meds of Seroquel for now and let psych decide if she should continue.  Filed Vitals:   07/24/14 2100  BP: 100/58  Pulse: 81  Temp:   Resp: [address]21      Tammie Yanda G Abrian Hanover, PA-C 07/24/14 2156  Linwood DibblesJon Knapp, MD 07/24/14 [phone]

## 2014-07-24 NOTE — ED Notes (Signed)
Bed: WA17 Expected date:  Expected time:  Means of arrival:  Comments: EMS-OD 

## 2014-07-24 NOTE — ED Notes (Signed)
Pt has one bag of belongings that include one pair of grey leggings, one pair of grey pants, one grey shirt, one bra, and pair of black shoes, and one black shirt.

## 2014-07-24 NOTE — ED Notes (Signed)
Patient will try to urinate after PA finish talking with patient
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
## 2014-07-24 NOTE — ED Notes (Signed)
Suicidal ideation. Took 2000mg  seroquel, 5 handfuls of excedrin PM at 1415

## 2014-07-24 NOTE — ED Notes (Signed)
Pt transferred from main ED, presents as OD of Excedrin and Seroquel at approx. 2pm earlier today.  Pt reports her boyfriend left her today and she can't pay her rent, pt sad and tearful.  Denies HI or visual hallucinations, admits to auditory hallucinations telling her she is a horrible person and doesn't deserve to be alive.  Pt reports she attempted SI at age 23 by taking pills and cutting. Denies alcohol or drug abuse.  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  Pt nauseated and vomiting x 1 in psych ed, meds given.  Will continue to monitor for safety.

## 2014-07-24 NOTE — ED Notes (Signed)
Poison control: risk for seizures, watch QT interval and may need magnesium replacement. For anticholinergic symptoms give benzo's. Acetaminophen level.  Check electrolytes. Kept until Asymptomatic. Watch for cardiac issues.

## 2014-07-24 NOTE — BHH Counselor (Signed)
Per Elizabeth SievertSpencer Simon, PA, pt meets inpatient criteria. [facility]. TTS will seek placement elsewhere.  TTS Counselor spoke with pt's attending physician Marlon Pel(Tiffany Greene, PA-C) and informed her of disposition. PA-C is in agreement with plan.      Cyndie MullAnna Daltyn Degroat, [facility] Triage Specialist

## 2014-07-25 ENCOUNTER — Encounter (HOSPITAL_COMMUNITY): Payer: Self-pay | Admitting: *Deleted

## 2014-07-25 ENCOUNTER — Encounter (HOSPITAL_COMMUNITY): Payer: Self-pay | Admitting: Registered Nurse

## 2014-07-25 ENCOUNTER — Inpatient Hospital Stay (HOSPITAL_COMMUNITY)
Admission: AD | Admit: 2014-07-25 | Discharge: 2014-07-31 | DRG: 885 | Disposition: A | Discharge status: Home / Self Care | Payer: 59 | Source: Intra-hospital | Attending: Psychiatry | Admitting: Psychiatry

## 2014-07-25 DIAGNOSIS — F29 Unspecified psychosis not due to a substance or known physiological condition: Secondary | ICD-10-CM

## 2014-07-25 DIAGNOSIS — R45851 Suicidal ideations: Secondary | ICD-10-CM | POA: Diagnosis present

## 2014-07-25 DIAGNOSIS — T398X2A Poisoning by other nonopioid analgesics and antipyretics, not elsewhere classified, intentional self-harm, initial encounter: Secondary | ICD-10-CM | POA: Diagnosis not present

## 2014-07-25 DIAGNOSIS — F4310 Post-traumatic stress disorder, unspecified: Secondary | ICD-10-CM | POA: Diagnosis present

## 2014-07-25 DIAGNOSIS — F3164 Bipolar disorder, current episode mixed, severe, with psychotic features: Secondary | ICD-10-CM | POA: Diagnosis present

## 2014-07-25 DIAGNOSIS — F329 Major depressive disorder, single episode, unspecified: Secondary | ICD-10-CM | POA: Diagnosis present

## 2014-07-25 DIAGNOSIS — F1290 Cannabis use, unspecified, uncomplicated: Secondary | ICD-10-CM

## 2014-07-25 DIAGNOSIS — F3130 Bipolar disorder, current episode depressed, mild or moderate severity, unspecified: Principal | ICD-10-CM | POA: Insufficient documentation

## 2014-07-25 DIAGNOSIS — Z87891 Personal history of nicotine dependence: Secondary | ICD-10-CM

## 2014-07-25 HISTORY — DX: Dizziness and giddiness: R42

## 2014-07-25 HISTORY — DX: Anemia, unspecified: D64.9

## 2014-07-25 HISTORY — DX: Reserved for concepts with insufficient information to code with codable children: IMO0002

## 2014-07-25 LAB — SALICYLATE LEVEL: Salicylate Lvl: 15.8 mg/dL (ref 2.8–20.0)

## 2014-07-25 MED ORDER — DIVALPROEX SODIUM 250 MG PO DR TAB
250.0000 mg | DELAYED_RELEASE_TABLET | Freq: Two times a day (BID) | ORAL | Status: DC
Start: 2014-07-25 — End: 2014-07-26
  Administered 2014-07-25 – 2014-07-26 (×2): 250 mg via ORAL
  Filled 2014-07-25 (×6): qty 1

## 2014-07-25 MED ORDER — MAGNESIUM HYDROXIDE 400 MG/5ML PO SUSP: 30.0000 mL | Freq: Every day | ORAL | Status: DC | PRN

## 2014-07-25 MED ORDER — INFLUENZA VAC SPLIT QUAD 0.5 ML IM SUSY
0.5000 mL | PREFILLED_SYRINGE | INTRAMUSCULAR | Status: AC
  Administered 2014-07-26: 0.5 mL via INTRAMUSCULAR
  Filled 2014-07-25: qty 0.5

## 2014-07-25 MED ORDER — ARIPIPRAZOLE 5 MG PO TABS
5.0000 mg | ORAL_TABLET | Freq: Every day | ORAL | Status: DC
  Administered 2014-07-26 – 2014-07-31 (×6): 5 mg via ORAL
  Filled 2014-07-25 (×8): qty 1

## 2014-07-25 MED ORDER — TRAZODONE HCL 50 MG PO TABS
50.0000 mg | ORAL_TABLET | Freq: Every evening | ORAL | Status: DC | PRN
Start: 2014-07-25 — End: 2014-07-25

## 2014-07-25 MED ORDER — TRAZODONE HCL 50 MG PO TABS
50.0000 mg | ORAL_TABLET | Freq: Every evening | ORAL | Status: DC | PRN
  Administered 2014-07-25: 50 mg via ORAL
  Filled 2014-07-25 (×2): qty 1

## 2014-07-25 MED ORDER — ARIPIPRAZOLE 5 MG PO TABS
5.0000 mg | ORAL_TABLET | Freq: Every day | ORAL | Status: DC
  Administered 2014-07-25: 5 mg via ORAL
  Filled 2014-07-25: qty 1

## 2014-07-25 MED ORDER — DIVALPROEX SODIUM 250 MG PO DR TAB: 250.0000 mg | DELAYED_RELEASE_TABLET | Freq: Two times a day (BID) | ORAL | Status: DC

## 2014-07-25 MED ORDER — ONDANSETRON 4 MG PO TBDP
4.0000 mg | ORAL_TABLET | Freq: Three times a day (TID) | ORAL | Status: DC | PRN
  Administered 2014-07-25 – 2014-07-28 (×6): 4 mg via ORAL
  Filled 2014-07-25 (×6): qty 1

## 2014-07-25 MED ORDER — PNEUMOCOCCAL VAC POLYVALENT 25 MCG/0.5ML IJ INJ
0.5000 mL | INJECTION | INTRAMUSCULAR | Status: AC
  Administered 2014-07-26: 0.5 mL via INTRAMUSCULAR

## 2014-07-25 NOTE — BHH Counselor (Addendum)
Per Bunnie Pionori, AC at [facility], [facility] adult female [facility] but possibility of discharges tomorrow.  Contacted the following facilities for placement:  FAXED CLINICAL INFORMATION, PT IS UNDER REVIEW: [facility], [facility], [facility]   AT CAPACITY: [facility]  NO RESPONSE: [facility] - fax would not go through    Cyndie MullAnna Ulices Maack, [facility] Triage Specialist

## 2014-07-25 NOTE — BHH Counselor (Signed)
TTS Counselor informed pt of disposition. Pt reports feeling nauseous and is still somewhat tearful. Pt understands she will be sent to another hospital whenever placement is found.        Cyndie MullAnna Xitlalic Maslin, [facility] Triage Specialist

## 2014-07-25 NOTE — Tx Team (Signed)
Initial Interdisciplinary Treatment Plan   PATIENT STRESSORS: Financial difficulties Marital or family conflict Occupational concerns   PATIENT STRENGTHS: Ability for insight Capable of independent living Communication skills General fund of knowledge Special hobby/interest Supportive family/friends Work skills   PROBLEM LIST: Problem List/Patient Goals Date to be addressed Date deferred Reason deferred Estimated date of resolution  Depression 07/25/2014     Safety- suicidal ideation 07/25/2014     "Get the help that I need" 07/25/2014     "Self control so I don't want to do what I did yesterday" Referring to suicide attempt 07/25/2014                                    DISCHARGE CRITERIA:  Ability to meet basic life and health needs Improved stabilization in mood, thinking, and/or behavior Motivation to continue treatment in a less acute level of care Verbal commitment to aftercare and medication compliance  PRELIMINARY DISCHARGE PLAN: Outpatient therapy Return to previous living arrangement Return to previous work or school arrangements  PATIENT/FAMIILY INVOLVEMENT: This treatment plan has been presented to and reviewed with the patient, Shellee MiloKathryn Vandruff.  The patient and family have been given the opportunity to ask questions and make suggestions.  Lendell CapriceGuthrie, Kae Lauman A 07/25/2014, 6:00 PM

## 2014-07-25 NOTE — Progress Notes (Signed)
D:Patient in her room on approach.  Patient states she had a good day.  Patient states she is trying to get adjusted to the unit.  Patient states she is tired and states she has not slept in days. Patient denies SI/HI and denies [facility].  Patient appears sad and blunted.   A: Staff to monitor Q 15 mins for safety.  Encouragement and support offered.  Scheduled medications administered per orders. R: Patient remains safe on the unit.  Patient did not attend group tonight.  Patient taking administered medications.  Patient visible on the unit and interacting with peers.

## 2014-07-25 NOTE — [facility] Group Notes (Signed)
Adult Psychoeducational Group Note  Date:  07/25/2014 Time:  9:58 PM  Group Topic/Focus:  AA Meeting  Participation Level:  Did Not Attend  Participation Quality:  None  Affect:  None  Cognitive:  None  Insight: None  Engagement in Group:  None  Modes of Intervention:  Discussion and Education  Additional Comments:  Samara DeistKathryn did not attend group.  Caroll RancherLindsay, Nylani Michetti A 07/25/2014, 9:58 PM
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
## 2014-07-25 NOTE — BH Assessment (Signed)
[facility] Assessment Progress Note  [facility] Thedore MinsMojeed Akintayo, MD, Rm [id number].  Pt signed Consent to Release Information to Tiajuana AmassScott Cunningham, MD and to [facility].  This form and IVC [facility]  Pt's nurse, Minerva Areolaric, has been notified.  Doylene Canninghomas Shanti Eichel, MA Triage Specialist 07/25/2014 @ 13:04

## 2014-07-25 NOTE — Progress Notes (Signed)
23 [place]. Initially listed as self pay but when talking with pt she confirmed she is covered by her father, Brett [place] with [employer] [employer] [place] plan  NO pcp as confirmed by pt [facility] ED CM spoke with pt on how to obtain an in network pcp with insurance coverage via the customer service number or web site  Cm reviewed ED level of care for crisis/emergent services and community pcp level of care to manage continuous or chronic medical concerns.  The pt voiced understanding CM encouraged pt and discussed pt's responsibility to verify with pt's insurance carrier that any recommended medical provider offered by any emergency room or a hospital provider is within the carrier's network. The pt voiced understanding

## 2014-07-25 NOTE — ED Notes (Signed)
Poison Control called to state if Salicylate level trending up repeat EKG and Salicylate level, give IV fluids, if Salicylate level greater than 40 call Poison Control.

## 2014-07-25 NOTE — ED Notes (Signed)
Pt drinking Gatorade at present.
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
## 2014-07-26 DIAGNOSIS — F4310 Post-traumatic stress disorder, unspecified: Secondary | ICD-10-CM

## 2014-07-26 DIAGNOSIS — R45851 Suicidal ideations: Secondary | ICD-10-CM

## 2014-07-26 DIAGNOSIS — F3130 Bipolar disorder, current episode depressed, mild or moderate severity, unspecified: Secondary | ICD-10-CM | POA: Insufficient documentation

## 2014-07-26 MED ORDER — TRAZODONE HCL 50 MG PO TABS
50.0000 mg | ORAL_TABLET | Freq: Every evening | ORAL | Status: DC | PRN
  Administered 2014-07-26: 50 mg via ORAL
  Filled 2014-07-26 (×3): qty 1

## 2014-07-26 MED ORDER — LAMOTRIGINE 25 MG PO TABS
25.0000 mg | ORAL_TABLET | Freq: Every day | ORAL | Status: DC
  Administered 2014-07-26 – 2014-07-31 (×6): 25 mg via ORAL
  Filled 2014-07-26 (×7): qty 1

## 2014-07-26 MED ORDER — DIPHENHYDRAMINE HCL 50 MG PO CAPS
50.0000 mg | ORAL_CAPSULE | Freq: Once | ORAL | Status: AC
  Administered 2014-07-26: 50 mg via ORAL
  Filled 2014-07-26: qty 1
  Filled 2014-07-26: qty 2

## 2014-07-26 MED ORDER — BISACODYL 5 MG PO TBEC
10.0000 mg | DELAYED_RELEASE_TABLET | Freq: Every day | ORAL | Status: DC | PRN
  Administered 2014-07-26: 10 mg via ORAL
  Filled 2014-07-26: qty 2

## 2014-07-26 MED ORDER — LITHIUM CARBONATE 300 MG PO CAPS
300.0000 mg | ORAL_CAPSULE | Freq: Two times a day (BID) | ORAL | Status: DC
  Administered 2014-07-26 – 2014-07-27 (×3): 300 mg via ORAL
  Filled 2014-07-26 (×6): qty 1

## 2014-07-26 MED ORDER — TRAZODONE HCL 50 MG PO TABS: 50.0000 mg | ORAL_TABLET | Freq: Every evening | ORAL | Status: DC | PRN
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
## 2014-07-26 NOTE — Progress Notes (Addendum)
Patient ID: Elizabeth Little, female   DOB: 12-30-1990, 23 y.o.   MRN: [id number]  D: Pt currently presents with a blunted affect and depressed behavior. Per self inventory, pt rates depression at a 8, hopelessness 9 and anxiety 7. Pt's daily goal is "understanding myself and my problems" and they intend to do so by "be honest with the people trying to help." Pt reports poor sleep, concentration and appetite. Pt reported nausea this morning.    A: Pt provided with scheduled and PRN medications per providers orders. Pt's labs and vitals were monitored throughout the day. Pt supported emotionally and encouraged to express concerns and questions. Pt consulted with provider and Clinical research associatewriter. Pt educated on alternative treatment for nausea and current medications.   R: Pt's safety ensured with 15 minute and environmental checks. Pt currently denies SI/HI and visual hallucinations. Pt endorses auditory hallucinations stating that they "are quieting down" but that it's mostly "people telling me bad things about myself." Pt verbally agrees to seek staff if SI/HI or worsening A/VH occurs and to consult with staff before acting on these thoughts. Pt reports that she has a tattoo for every year she has been alive, her friend designs and then does her tattoos. Pt reports that "me telling myself I'm okay when I'm not" would be something that would keep her from keeping her discharge plans.    Aurora Maskwyman, Aala Ransom E, RN

## 2014-07-26 NOTE — Plan of Care (Signed)
Problem: Ineffective individual coping Goal: STG: Patient will remain free from self harm Outcome: Progressing     
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
## 2014-07-26 NOTE — Progress Notes (Addendum)
D:Patient in the hallway on approach.  Patient states she has a lot of problems with her stomach and she states she has been constipated.  Patient states she has increased her fluid intake. Patient states she showered tonight and she feels much better.  Patient states she has been anxious today and states she has been worried about taking new medications.  Patient was also worried about not sleeping tonight.  Patient denies SI/HI and  AVH.   A: Staff to monitor Q 15 mins for safety.  Encouragement and support offered.  Scheduled medications administered per orders. R: Patient remains safe on the unit.  Patient did not attend group tonight.  Patient visible on the unit and interacting with peers.  Patient taking administered medications.
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
## 2014-07-27 LAB — TSH: TSH: 1.11 u[IU]/mL (ref 0.350–4.500)

## 2014-07-27 LAB — BASIC METABOLIC PANEL
Anion gap: 14 (ref 5–15)
BUN: 9 mg/dL (ref 6–23)
CHLORIDE: 101 meq/L (ref 96–112)
CO2: 22 mEq/L (ref 19–32)
Calcium: 9.6 mg/dL (ref 8.4–10.5)
Creatinine, Ser: 0.74 mg/dL (ref 0.50–1.10)
Glucose, Bld: 103 mg/dL — ABNORMAL HIGH (ref 70–99)
POTASSIUM: 3.7 meq/L (ref 3.7–5.3)
SODIUM: 137 meq/L (ref 137–147)

## 2014-07-27 LAB — LIPID PANEL
Cholesterol: 164 mg/dL (ref 0–200)
HDL: 73 mg/dL (ref >39)
LDL CALC: 82 mg/dL (ref 0–99)
Total CHOL/HDL Ratio: 2.2 RATIO
Triglycerides: 47 mg/dL (ref <150)
VLDL: 9 mg/dL (ref 0–40)

## 2014-07-27 LAB — HEMOGLOBIN A1C
Hgb A1c MFr Bld: 5.5 % (ref <5.7)
Mean Plasma Glucose: 111 mg/dL (ref <117)

## 2014-07-27 NOTE — Progress Notes (Signed)
Patient did attend the evening karaoke group. Pt was engaged, supportive, and participated by singing several songs.   

## 2014-07-27 NOTE — [facility] Suicide Risk Assessment (Signed)
[facility] INPATIENT:  Family/Significant Other Suicide Prevention Education  Suicide Prevention Education:  Education Completed; Elizabeth Little, Father, [phone];  has been identified by the patient as the family member/significant other with whom the patient will be residing, and identified as the person(s) who will aid the patient in the event of a mental health crisis (suicidal ideations/suicide attempt).  With written consent from the patient, the family member/significant other has been provided the following suicide prevention education, prior to the and/or following the discharge of the patient.  The suicide prevention education provided includes the following:  Suicide risk factors  Suicide prevention and interventions  National Suicide Hotline telephone number  [facility] assessment telephone number  [facility] Assistance 911  [facility] and/or [facility]idential Mobile Crisis [facility] telephone number  Request made of family/significant other to:  Remove weapons (e.g., guns, rifles, knives), all items previously/currently identified as safety concern.   Father advised patient does not have access to weapons.     Remove drugs/medications (over-the-counter, prescriptions, illicit drugs), all items previously/currently identified as a safety concern.  The family member/significant other verbalizes understanding of the suicide prevention education information provided.  The family member/significant other agrees to remove the items of safety concern listed above.  [name], Elizabeth Roorda Hairston 07/27/2014, 3:38 PM

## 2014-07-27 NOTE — Progress Notes (Signed)
D: Patient denies SI/HI and A/V hallucinations; patient reported around 1400 about nausea, vomiting, and having bowel movements that were unwitnessed and not mentioned until this afternoon but reports have been having all day'; patient reports she believes its the dulcolax she received at a prior time; patient has been given saltine, ginergale, and gatorade, patient refused her 1700 dose of lithium  A: Monitored q 15 minutes; patient encouraged to attend groups; patient educated about medications; patient given medications per physician orders; patient encouraged to express feelings and/or concerns  R: Patient is childlike and has an anxiety episode and complained of anxiety due to the construction noise while sitting by the construction hall; patient is assertive and encouraged to eat only what she could tolerate during dinner; patient reports she only ate fruit; patient given medication to help with nausea; patient interacting with peers; patient was able to set goal to talk with staff 1:1 when having feelings of SI

## 2014-07-27 NOTE — [facility] Group Notes (Signed)
[facility] LCSW Group Therapy  [facility] 1:15 - 2:30 PM  07/27/2014   Type of Therapy:  Group Therapy  Participation Level: Minimal  Participation Quality:  Minimal  Affect:  Appropriate  Cognitive:  Appropriate  Insight:  Developing/Improving   Engagement in Therapy:  Developing/Improving   Modes of Intervention:  Discussion, Education, Exploration, Problem-Solving, Rapport Building, Support   Summary of Progress/Problems:   Patient was attentive to speaker from the [employer] [facility] [employer] as he shared his story of dealing with mental health/substance abuse issues and overcoming it by working a recovery program.  Patient made no comments on the presentation.  [name], Abiha Lukehart Hairston 07/27/2014

## 2014-07-27 NOTE — Progress Notes (Signed)
[facility] MD Progress Note  07/27/2014 3:55 PM Elizabeth Little  MRN:  [id number] Subjective:   Patient states she feels depressed, she feels she easily cries, and tends to feel vaguely  guilty " as if I did something wrong or something".  Objective: I have discussed case with treatment team and have met with patient. As per staff she has been visible on unit, but  Group participation has been limited. Grooming has improved. No disruptive, agitated, or self injurious behaviors on the unit. Patient remains quite depressed but does seem better than upon admission. She is still, however, depressed, sad , and becoming tearful during our session, although to a lesser degree than yesterday.  She also presents with anxiety. She denies side effects from medications . Patient continues to ruminate about recent losses, particularly recent break up with BF. TSH, Lipid Panel, HgbA1C within normal limits.    Diagnosis:   Bipolar Disorder Depressed   Total Time spent with patient: 20 minutes    ADL's: improving  Sleep: fair   Appetite: fair   Suicidal Ideation:  Denies suicidal plans or intention and contracts for safety on the unit  Homicidal Ideation:  Denies  AEB (as evidenced by):  Psychiatric Specialty Exam: Physical Exam  ROS  Blood pressure 125/95, pulse 125, temperature 98.8 F (37.1 C), temperature source Oral, resp. rate 18, height 5' 4.75" (1.645 m), weight 69.4 kg (153 lb), last menstrual period 06/26/2014, SpO2 100 %.Body mass index is 25.65 kg/(m^2).  General Appearance: improved grooming  Eye Contact::  Good  Speech:  Normal Rate  Volume:  Normal  Mood:  Depressed  Affect:  still constricted but less tearful, less despondent  Thought Process:  Linear  Orientation:  Other:  fully alert and attentive  Thought Content:  No hallucinations reported today and does not appear internally preoccupied at this time  Suicidal Thoughts:  No at this time denies any plans or intention of  hurting self and  contracts for safety on unit   Homicidal Thoughts:  No  Memory:  Recent and Remote grossly intact  Judgement:  Fair  Insight:  Fair  Psychomotor Activity:  Normal  Concentration:  Good  Recall:  Good  Fund of Knowledge:Good  Language: Good  Akathisia:  Negative  Handed:  Right  AIMS (if indicated):     Assets:  Desire for Improvement Physical Health Resilience  Sleep:  Number of Hours: 4.5   Musculoskeletal: Strength & Muscle Tone: within normal limits Gait & Station: normal Patient leans: N/A  Current Medications: Current Facility-Administered Medications  Medication Dose Route Frequency Provider Last Rate Last Dose  . ARIPiprazole (ABILIFY) tablet 5 mg  5 mg Oral Daily Shuvon Rankin, NP   5 mg at 07/27/14 0756  . bisacodyl (DULCOLAX) EC tablet 10 mg  10 mg Oral Daily PRN Nicholaus Bloom, MD   10 mg at 07/26/14 1514  . lamoTRIgine (LAMICTAL) tablet 25 mg  25 mg Oral Daily Jenne Campus, MD   25 mg at 07/27/14 0757  . lithium carbonate capsule 300 mg  300 mg Oral BID WC Jenne Campus, MD   300 mg at 07/27/14 0756  . ondansetron (ZOFRAN-ODT) disintegrating tablet 4 mg  4 mg Oral Q8H PRN Laverle Hobby, PA-C   4 mg at 07/27/14 1443    Lab Results:  Results for orders placed or performed during the hospital encounter of 07/25/14 (from the past 48 hour(s))  TSH     Status: None  Collection Time: 07/27/14  6:25 AM  Result Value Ref Range   TSH 1.110 0.350 - 4.500 uIU/mL    Comment: Performed at [facility]  Lipid panel     Status: None   Collection Time: 07/27/14  6:25 AM  Result Value Ref Range   Cholesterol 164 0 - 200 mg/dL   Triglycerides 47 <150 mg/dL   HDL 73 >39 mg/dL   Total CHOL/HDL Ratio 2.2 RATIO   VLDL 9 0 - 40 mg/dL   LDL Cholesterol 82 0 - 99 mg/dL    Comment:        Total Cholesterol/HDL:CHD Risk Coronary Heart Disease Risk Table                     Men   Women  1/2 Average Risk   3.4   3.3  Average Risk       5.0   4.4   2 X Average Risk   9.6   7.1  3 X Average Risk  23.4   11.0        Use the calculated Patient Ratio above and the CHD Risk Table to determine the patient's CHD Risk.        ATP III CLASSIFICATION (LDL):  <100     mg/dL   Optimal  100-129  mg/dL   Near or Above                    Optimal  130-159  mg/dL   Borderline  160-189  mg/dL   High  >190     mg/dL   Very High Performed at [facility]   Hemoglobin A1c     Status: None   Collection Time: 07/27/14  6:25 AM  Result Value Ref Range   Hgb A1c MFr Bld 5.5 <5.7 %    Comment: (NOTE)                                                                       According to the ADA Clinical Practice Recommendations for 2011, when HbA1c is used as a screening test:  >=6.5%   Diagnostic of Diabetes Mellitus           (if abnormal result is confirmed) 5.7-6.4%   Increased risk of developing Diabetes Mellitus References:Diagnosis and Classification of Diabetes Mellitus,Diabetes EKCM,0349,17(HXTAV 1):S62-S69 and Standards of Medical Care in         Diabetes - 2011,Diabetes Care,2011,34 (Suppl 1):S11-S61.    Mean Plasma Glucose 111 <117 mg/dL    Comment: Performed at [place] metabolic panel     Status: Abnormal   Collection Time: 07/27/14  6:25 AM  Result Value Ref Range   Sodium 137 137 - 147 mEq/L   Potassium 3.7 3.7 - 5.3 mEq/L   Chloride 101 96 - 112 mEq/L   CO2 22 19 - 32 mEq/L   Glucose, Bld 103 (H) 70 - 99 mg/dL   BUN 9 6 - 23 mg/dL   Creatinine, Ser 0.74 0.50 - 1.10 mg/dL   Calcium 9.6 8.4 - 10.5 mg/dL   GFR calc non Af Amer >90 >90 mL/min   GFR calc Af Amer >90 >90 mL/min  Comment: (NOTE) The eGFR has been calculated using the CKD EPI equation. This calculation has not been validated in all clinical situations. eGFR's persistently <90 mL/min signify possible Chronic Kidney Disease.    Anion gap 14 5 - 15    Comment: Performed at [facility]    Physical Findings: AIMS:  Facial and Oral Movements Muscles of Facial Expression: None, normal Lips and Perioral Area: None, normal Jaw: None, normal Tongue: None, normal,Extremity Movements Upper (arms, wrists, hands, fingers): None, normal Lower (legs, knees, ankles, toes): None, normal, Trunk Movements Neck, shoulders, hips: None, normal, Overall Severity Severity of abnormal movements (highest score from questions above): None, normal Incapacitation due to abnormal movements: None, normal Patient's awareness of abnormal movements (rate only patient's report): No Awareness, Dental Status Current problems with teeth and/or dentures?: No Does patient usually wear dentures?: No  CIWA:    COWS:      Assessment: At this time patient remains depressed and anxious, but less intensely depressed /tearful in affect. She is not currently suicidal and at this time is not experiencing hallucinations. She struggles with a sense of low self esteem and a sense of guilt.  She is tolerating medication regimen well thus far   Treatment Plan Summary: Daily contact with patient to assess and evaluate symptoms and progress in treatment Medication management See below  Plan: Continue inpatient treatment , milieu, support Continue Lithium 300 mgrs BID Continue Abilify 5 mgrs QDAY Continue Lamictal 25 mgrs QAM.   Medical Decision Making Problem Points:  Established problem, stable/improving (1), Review of last therapy session (1) and Review of psycho-social stressors (1) Data Points:  Review or order clinical lab tests (1) Review of medication regiment & side effects (2)  I certify that inpatient services furnished can reasonably be expected to improve the patient's condition.   Elizabeth Little 07/27/2014, 3:55 PM

## 2014-07-27 NOTE — [facility] Group Notes (Signed)
The focus of this group is to educate the patient on the purpose and policies of crisis stabilization and provide a format to answer questions about their admission.  The group details unit policies and expectations of patients while admitted. Patient attended this group and was engaging. 

## 2014-07-27 NOTE — Progress Notes (Signed)
Did not attend group 

## 2014-07-28 DIAGNOSIS — F321 Major depressive disorder, single episode, moderate: Secondary | ICD-10-CM

## 2014-07-28 MED ORDER — DIPHENHYDRAMINE HCL 25 MG PO CAPS
25.0000 mg | ORAL_CAPSULE | Freq: Four times a day (QID) | ORAL | Status: DC | PRN
  Administered 2014-07-28 – 2014-07-29 (×2): 25 mg via ORAL
  Filled 2014-07-28 (×3): qty 1

## 2014-07-28 MED ORDER — ONDANSETRON 4 MG PO TBDP
8.0000 mg | ORAL_TABLET | Freq: Three times a day (TID) | ORAL | Status: DC | PRN
  Administered 2014-07-28 – 2014-07-31 (×8): 8 mg via ORAL
  Filled 2014-07-28 (×8): qty 2

## 2014-07-28 MED ORDER — ZOLPIDEM TARTRATE 10 MG PO TABS
10.0000 mg | ORAL_TABLET | Freq: Every evening | ORAL | Status: DC | PRN
  Administered 2014-07-28 – 2014-07-30 (×3): 10 mg via ORAL
  Filled 2014-07-28 (×3): qty 1

## 2014-07-28 NOTE — Progress Notes (Signed)
D   Pt is pleasant on approach and still complains of some mild nausea   She wanted a pill to help her sleep but had not taken her lithium at dinner time so she received the lithium at hs  To help her go to sleep   She has been interacting well with others and attended group A   Verbal support given  Medications administered and effectiveness monitored    Q 15 min checks R   Pt safe at present

## 2014-07-28 NOTE — [facility] Group Notes (Signed)
[facility] LCSW Group Therapy  Feelings Around Relapse 1:15 -2:30        07/28/2014   Type of Therapy:  Group Therapy  Participation Level:  Appropriate  Participation Quality:  Appropriate  Affect:  Appropriate  Cognitive:  Attentive Appropriate  Insight:  Developing/Improving  Engagement in Therapy: Developing/Improving  Modes of Intervention:  Discussion Exploration Problem-Solving Supportive  Summary of Progress/Problems:  The topic for today was feelings around relapse. Patient processed feelings toward relapse and was able to relate to peers. She advised relapsing would be turn her phone on silent and getting inside of her head making negative comments about herself.  She also stated she would not get out of bed or take a shower.  Patient identified coping skills that can be used to prevent a relapse including calling friend who is very supportive.   [name], Oaklyn Jakubek Hairston 07/28/2014

## 2014-07-28 NOTE — Tx Team (Signed)
Interdisciplinary Treatment Plan Update   Date Reviewed:  07/28/2014  Time Reviewed:  8:46 AM  Progress in Treatment:   Attending groups: Yes Participating in groups: Yes Taking medication as prescribed: Yes  Tolerating medication: Yes Family/Significant other contact made:  Yes, collateral contact with father. Patient understands diagnosis: Yes, patient understands diagnosis and need for treatment. Discussing patient identified problems/goals with staff: Yes, patient is able to express goals for treatment and discharge. Medical problems stabilized or resolved: Yes Denies suicidal/homicidal ideation: Yes Patient has not harmed self or others: Yes  For review of initial/current patient goals, please see plan of care.  Estimated Length of Stay:  3-4 days  Reasons for Continued Hospitalization:  Anxiety Depression Medication stabilization   New Problems/Goals identified:    Discharge Plan or Barriers:   Home with outpatient follow up with [facility]  Additional Comments:  Patient and CSW reviewed patient's identified goals and treatment plan.  Patient verbalized understanding and agreed to treatment plan.   Attendees:  Patient:  07/28/2014 8:46 AM   Signature:   07/28/2014 8:46 AM  Signature: Geoffery LyonsIrving Lugo, MD 07/28/2014 8:46 AM  Signature:  Serena ColonelAggie Nwoko, NP  07/28/2014 8:46 AM  Signature: Robbie LouisVivian Kent, RN 07/28/2014 8:46 AM  Signature:   07/28/2014 8:46 AM  Signature:  Juline PatchQuylle Nayra Coury, LCSW 07/28/2014 8:46 AM  Signature:  Belenda CruiseKristin Drinkard, LCSW-A 07/28/2014 8:46 AM  Signature:   07/28/2014 8:46 AM  Signature:  07/28/2014 8:46 AM  Signature:  07/28/2014  8:46 AM  Signature:   Onnie BoerJennifer Clark, RN [facility] 07/28/2014  8:46 AM  Signature:  Santa GeneraAnne Cunningham Lead Social Worker LCSW 07/28/2014  8:46 AM    Scribe for Treatment Team:   Juline PatchQuylle Bleu Moisan,  07/28/2014 8:46 AM
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
## 2014-07-28 NOTE — [facility] Group Notes (Addendum)
[facility]   07/28/2014 9:45 AM  Participation Quality:  Patient did not attend group - refused to attend.   [name], Kaitlan Bin Hairston  07/28/2014  9:45 AM

## 2014-07-29 MED ORDER — TRIAMCINOLONE ACETONIDE 0.5 % EX CREA
TOPICAL_CREAM | Freq: Two times a day (BID) | CUTANEOUS | Status: DC
  Administered 2014-07-29 – 2014-07-31 (×4): via TOPICAL
  Filled 2014-07-29: qty 15

## 2014-07-29 MED ORDER — LORATADINE 10 MG PO TABS
10.0000 mg | ORAL_TABLET | Freq: Every day | ORAL | Status: DC
  Administered 2014-07-29 – 2014-07-31 (×3): 10 mg via ORAL
  Filled 2014-07-29 (×5): qty 1

## 2014-07-29 NOTE — Progress Notes (Signed)
D   Pt is less somatic and her mood has improved since yesterday   She continues to have some nausea but is relieved by zofran   She interacts well with others and is compliant with treatment A   Verbal support given   Medications administered and effectiveness monitored   Q 15 min checks R   Pt safe at present

## 2014-07-29 NOTE — Progress Notes (Signed)
Psychoeducational Group Note  Date:  07/29/2014 Time:  0027  Group Topic/Focus:  Developing a Wellness Toolbox:   The focus of this group is to help patients develop a "wellness toolbox" with skills and strategies to promote recovery upon discharge. Recovery Goals:   The focus of this group is to identify appropriate goals for recovery and establish a plan to achieve them.  Participation Level: Did Not Attend  Participation Quality:  Not Applicable  Affect:  Not Applicable  Cognitive:  Not Applicable  Insight:  Not Applicable  Engagement in Group: Not Applicable  Additional Comments:  The patient refused to attend group last evening since she stated that she was not a drug addict. The patient sat in the hallway where she talked to one of her peers.   Elizabeth Little S 07/29/2014, 1:27 AM

## 2014-07-29 NOTE — Progress Notes (Signed)
D  Samara DeistKathryn remains anxious, focused on her physical symptoms and worried about tomorrow. She stays  Seated on the hall floor, in between  Her scheduled groups  And says she feels most comfortable there.   A SHe contracts for safety with this nurse and is given claritin for c/o  Allergy-like symptoms and she is given  kenalog cream for her face excema.    R Safety is in place and poc maintained.

## 2014-07-29 NOTE — Progress Notes (Signed)
[facility] Group Notes:  (Nursing/MHT/Case Management/Adjunct)  Date:  07/29/2014  Time:  11:23 PM  Type of Therapy:  Wrap Up Group  Participation Level:  Active  Participation Quality:  Appropriate  Affect:  Appropriate  Cognitive:  Appropriate  Insight:  Appropriate  Engagement in Group:  Developing/Improving  Modes of Intervention:  Education  Summary of Progress/Problems: The patient verbalized in group that she had a rough start to her day, but things improved later on. When asked if she had done anything to deal with the situation, she indicated that talking to her nurse helped a great deal. The patient also verbalized that she attended the groups and had a good family visit. Her goal for tomorrow is to get more rest.   Elizabeth Little S 07/29/2014, 11:23 PM

## 2014-07-29 NOTE — Progress Notes (Signed)
Psychoeducational Group Note  Date: 07/29/2014 Time:  1015  Group Topic/Focus:  Identifying Needs:   The focus of this group is to help patients identify their personal needs that have been historically problematic and identify healthy behaviors to address their needs.  Participation Level:  Active  Participation Quality:  Appropriate  Affect:  Appropriate  Cognitive:  Oriented  Insight:  Improving  Engagement in Group:  Engaged  Additional Comments:  Pt attending and participating in the discussion. And then got up and left
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
## 2014-07-29 NOTE — [facility] Group Notes (Signed)
[facility] Group Notes:  (Nursing/MHT/Case Management/Adjunct)  Date:  07/29/2014  Time:  2:07 PM  Type of Therapy:  Psychoeducational Skills  Participation Level:  Did Not Attend  Participation Quality:  DID NOT ATTEND  Affect:  DID NOT ATTEND  Cognitive:  DID NOT ATTEND  Insight:  DID NOT ATTEND  Engagement in Group:  DID NOT ATTEND  Modes of Intervention:  DID NOT ATTEND  Summary of Progress/Problems: Pt did not attend patient self inventory group.   Jacquelyne BalintForrest, Aquil Duhe Shanta 07/29/2014, 2:07 PM

## 2014-07-30 MED ORDER — IBUPROFEN 800 MG PO TABS
ORAL_TABLET | ORAL | Status: AC
  Administered 2014-07-30: 800 mg
  Filled 2014-07-30: qty 1

## 2014-07-30 MED ORDER — IBUPROFEN 800 MG PO TABS
800.0000 mg | ORAL_TABLET | Freq: Three times a day (TID) | ORAL | Status: DC
  Administered 2014-07-30 – 2014-07-31 (×3): 800 mg via ORAL
  Filled 2014-07-30 (×7): qty 1

## 2014-07-30 MED ORDER — IBUPROFEN 800 MG PO TABS: 800.0000 mg | ORAL_TABLET | Freq: Three times a day (TID) | ORAL | Status: DC

## 2014-07-30 NOTE — Progress Notes (Signed)
Psychoeducational Group Note  Date: 07/30/2014 Time: 1015  Group Topic/Focus:  Making Healthy Choices:   The focus of this group is to help patients identify negative/unhealthy choices they were using prior to admission and identify positive/healthier coping strategies to replace them upon discharge.  Participation Level:  Active  Participation Quality:  Appropriate  Affect:  Anxious  Cognitive:  Appropriate  Insight:  Engaged  Engagement in Group:  Distracting  Additional Comments:    07/30/2014,6:52 PM Sigrid Schwebach, Joie BimlerPatricia Lynn Psychoeducational Group Note  Date: 07/30/2014 Time: 1015  Group Topic/Focus:  Making Healthy Choices:   The focus of this group is to help patients identify negative/unhealthy choices they were using prior to admission and identify positive/healthier coping strategies to replace them upon discharge.  Participation Level:  Active  Participation Quality:  Appropriate  Affect:  Anxious  Cognitive:  Appropriate  Insight:  Engaged  Engagement in Group:    Additional Comments:    07/30/2014,6:52 PM Nashya Garlington, Joie BimlerPatricia Lynn

## 2014-07-30 NOTE — Progress Notes (Signed)
[facility]  07/30/2014 2:13 PM Elizabeth [name]  MRN:  [id number] Subjective:  Elizabeth Little states she is starting to feel better. Her stomach is not hurting as is was with the Lithium. States that her appetite is not as good as she was not able to eat for a while there but slowly it is getting back to normal. Her mood is more stable. She is not going immediately back with her BF as states they need to deal with certain issues before she would be ready to get back with him. The rash is getting better with the cream and the Claritin Diagnosis:   DSM5: Trauma-Stressor Disorders:  Posttraumatic Stress Disorder (309.81) Depressive Disorders:  Major Depressive Disorder - Moderate (296.22) Total Time spent with patient: 30 minutes  Axis I: Bipolar, Depressed  ADL's:  Intact  Sleep: Fair  Appetite:  Poor   Psychiatric Specialty Exam: Physical Exam  Review of Systems  Constitutional: Negative.   HENT: Negative.   Eyes: Negative.   Respiratory: Negative.   Cardiovascular: Negative.   Gastrointestinal: Negative.   Genitourinary: Negative.   Musculoskeletal: Negative.   Skin: Negative.   Neurological: Negative.   Endo/Heme/Allergies: Negative.   Psychiatric/Behavioral: Positive for depression. The patient is nervous/anxious.     Blood pressure 127/75, pulse 106, temperature 98.2 F (36.8 C), temperature source Oral, resp. rate 16, height 5' 4.75" (1.645 m), weight 69.4 kg (153 lb), last menstrual period 06/26/2014, SpO2 100 %.Body mass index is 25.65 kg/(m^2).  General Appearance: Fairly Groomed  Patent attorneyye Contact::  Fair  Speech:  Clear and Coherent  Volume:  Normal  Mood:  Anxious and worried  Affect:  anxious worried  Thought Process:  Coherent and Goal Directed  Orientation:  Full (Time, Place, and Person)  Thought Content:  symptoms events worries concerns plans as she anticipates moving on  Suicidal Thoughts:  No  Homicidal Thoughts:  No  Memory:  Immediate;   Fair Recent;    Fair Remote;   Fair  Judgement:  Fair  Insight:  Present  Psychomotor Activity:  Normal  Concentration:  Fair  Recall:  [employer]  Fund of Knowledge:NA  Language: Fair  Akathisia:  No  Handed:    AIMS (if indicated):     Assets:  Desire for Improvement Housing Social Support  Sleep:  Number of Hours: 6.5   Musculoskeletal: Strength & Muscle Tone: within normal limits Gait & Station: normal Patient leans: N/A  Current Medications: Current Facility-Administered Medications  Medication Dose Route Frequency Provider Last Rate Last Dose  . ARIPiprazole (ABILIFY) tablet 5 mg  5 mg Oral Daily Shuvon Rankin, NP   5 mg at 07/30/14 0802  . bisacodyl (DULCOLAX) EC tablet 10 mg  10 mg Oral Daily PRN Rachael FeeIrving A Arius Harnois, MD   10 mg at 07/26/14 1514  . diphenhydrAMINE (BENADRYL) capsule 25 mg  25 mg Oral Q6H PRN Rachael FeeIrving A Alic Hilburn, MD   25 mg at 07/29/14 2238  . ibuprofen (ADVIL,MOTRIN) tablet 800 mg  800 mg Oral TID Rachael FeeIrving A Donnelle Rubey, MD   800 mg at 07/30/14 1200  . lamoTRIgine (LAMICTAL) tablet 25 mg  25 mg Oral Daily Craige CottaFernando A Cobos, MD   25 mg at 07/30/14 0802  . loratadine (CLARITIN) tablet 10 mg  10 mg Oral Daily Rachael FeeIrving A Ashyia Schraeder, MD   10 mg at 07/30/14 0802  . ondansetron (ZOFRAN-ODT) disintegrating tablet 8 mg  8 mg Oral Q8H PRN Rachael FeeIrving A Marytza Grandpre, MD   8 mg at 07/30/14 0802  .  triamcinolone cream (KENALOG) 0.5 %   Topical BID Rachael FeeIrving A Andres Escandon, MD      . zolpidem [facility](AMBIEN) tablet 10 mg  10 mg Oral QHS PRN Rachael FeeIrving A Pansy Ostrovsky, MD   10 mg at 07/29/14 2238    Lab Results: No results found for this or any previous visit (from the past 48 hour(s)).  Physical Findings: AIMS: Facial and Oral Movements Muscles of Facial Expression: None, normal Lips and Perioral Area: None, normal Jaw: None, normal Tongue: None, normal,Extremity Movements Upper (arms, wrists, hands, fingers): None, normal Lower (legs, knees, ankles, toes): None, normal, Trunk Movements Neck, shoulders, hips: None, normal, Overall Severity Severity of  abnormal movements (highest score from questions above): None, normal Incapacitation due to abnormal movements: None, normal Patient's awareness of abnormal movements (rate only patient's report): No Awareness, Dental Status Current problems with teeth and/or dentures?: No Does patient usually wear dentures?: No  CIWA:    COWS:     Treatment Plan Summary: Daily contact with patient to assess and evaluate symptoms and progress in treatment Medication management  Plan: Supportive approach/coping skills/CBT;mindfulness           Pursue the Lamictal/Abilify combination  Medical Decision Making Problem Points:  Review of psycho-social stressors (1) Data Points:  Review of medication regiment & side effects (2)  I certify that inpatient services furnished can reasonably be expected to improve the patient's condition.   Savana Spina A 07/30/2014, 2:13 PM

## 2014-07-30 NOTE — Progress Notes (Signed)
D   Pt is less somatic and her mood has improved since yesterday   She continues to have some nausea but is relieved by zofran   She interacts well with others and is compliant with treatment   Pt mood has improved more today   She socializes with peers and has not been as somatic and complaining A   Verbal support given   Medications administered and effectiveness monitored   Q 15 min checks R   Pt safe at present

## 2014-07-30 NOTE — Progress Notes (Signed)
Psychoeducational Group Note  Date:  07/30/2014 Time: 1015 Group Topic/Focus:  Making Healthy Choices:   The focus of this group is to help patients identify negative/unhealthy choices they were using prior to admission and identify positive/healthier coping strategies to replace them upon discharge.  Participation Level:  Minimal  Participation Quality:  Attentive  Affect:  Blunted  Cognitive:  Lacking  Insight:    Engagement in Group:  Engaged  Additional Comments:    Rich Braveuke, Krista Som Lynn 6:21 PM. 07/30/2014

## 2014-07-31 MED ORDER — ZOLPIDEM TARTRATE 10 MG PO TABS: 10.0000 mg | ORAL_TABLET | Freq: Every evening | ORAL | Status: DC | PRN

## 2014-07-31 MED ORDER — ARIPIPRAZOLE 5 MG PO TABS: 5.0000 mg | ORAL_TABLET | Freq: Every day | ORAL | Status: DC

## 2014-07-31 MED ORDER — LAMOTRIGINE 25 MG PO TABS: 25.0000 mg | ORAL_TABLET | Freq: Every day | ORAL | Status: DC

## 2014-07-31 NOTE — Progress Notes (Signed)
Adult Psychoeducational Group Note  Date:  07/31/2014 Time:  10:00AM  Group Topic/Focus:  Making Healthy Choices:   The focus of this group is to help patients identify negative/unhealthy choices they were using prior to admission and identify positive/healthier coping strategies to replace them upon discharge.  Participation Level:  Active  Participation Quality:  Appropriate  Affect:  Appropriate  Cognitive:  Appropriate  Insight: Appropriate  Engagement in Group:  Engaged  Modes of Intervention:  Activity and Discussion  Additional Comments:  Pt was active during group. Pt identified "ABC's of Leisure," identifying a positive activity associated with every letter of the alphabet (A for aerobics, B for bad mitten and C for cycling). Pt also participated in the group activity. Pt played the game "Piedad ClimesGesstures" with staff and peers.  Evi Mccomb K 07/31/2014, 11:12 AM

## 2014-07-31 NOTE — Progress Notes (Signed)
Psychoeducational Group Note  Date:  07/30/2014 Time:  2340  Group Topic/Focus:  Wrap-Up Group:   The focus of this group is to help patients review their daily goal of treatment and discuss progress on daily workbooks.  Participation Level: Did Not Attend  Participation Quality:  Not Applicable  Affect:  Not Applicable  Cognitive:  Not Applicable  Insight:  Not Applicable  Engagement in Group: Not Applicable  Additional Comments:  The patient did not attend group since she remained in the hallway.   Yoshiaki Kreuser S 07/31/2014, 1:30 AM

## 2014-07-31 NOTE — Progress Notes (Signed)
[facility] :  Will you be returning to the same living situation after discharge: No, patient reports that she will stay with a friend before returning home At discharge, do you have transportation home?:Yes,  patient reports access to transportation Do you have the ability to pay for your medications:Yes,  patient will be provided with prescriptions at discharge  Release of information consent forms completed and in the chart;  Patient's signature needed at discharge.  Patient to Follow up at: Follow-up Information    Follow up with Dr. Maeola Sarahunninghanm - [facility] On 08/01/2014.   Why:  You are scheduled with Dr. Tomasa Randunningham on Tuesday, August 01, 2014 at 10:15 AM   Contact information:   [address] [place], [place]   [address]  [phone]      Follow up with Katlin Hecox - [facility] On 08/07/2014.   Why:  You are scheduled with Katlin on Monday, August 07, 2014 at [facility]9AM   Contact information:   [address] [place], [place]  [address]  [phone]       Patient denies SI/HI:   Yes,  denies    Safety Planning and Suicide Prevention discussed:  Yes,  with patient and father  Johny ChessDrinkard, Sylvanna Burggraf L 07/31/2014, 11:03 AM

## 2014-07-31 NOTE — [facility] Suicide Risk Assessment (Signed)
Suicide Risk Assessment  Discharge Assessment     Demographic Factors:  Adolescent or young adult and Caucasian  Total Time spent with patient: 30 minutes  Psychiatric Specialty Exam:     Blood pressure 115/82, pulse 100, temperature 97.9 F (36.6 C), temperature source Oral, resp. rate 18, height 5' 4.75" (1.645 m), weight 69.4 kg (153 lb), last menstrual period 06/26/2014, SpO2 100 %.Body mass index is 25.65 kg/(m^2).  General Appearance: Fairly Groomed  Patent attorneyye Contact::  Fair  Speech:  Clear and Coherent  Volume:  Normal  Mood:  Euthymic  Affect:  Appropriate  Thought Process:  Coherent and Goal Directed  Orientation:  Full (Time, Place, and Person)  Thought Content:  plans as she moves forward  Suicidal Thoughts:  No  Homicidal Thoughts:  No  Memory:  Immediate;   Fair Recent;   Fair Remote;   Fair  Judgement:  Fair  Insight:  Present  Psychomotor Activity:  Normal  Concentration:  Fair  Recall:  FiservFair  Fund of Knowledge:NA  Language: Fair  Akathisia:  No  Handed:    AIMS (if indicated):     Assets:  Desire for Improvement Housing Social Support Vocational/Educational  Sleep:  Number of Hours: 6.25    Musculoskeletal: Strength & Muscle Tone: within normal limits Gait & Station: normal Patient leans: N/A   Mental Status Per Nursing Assessment::   On Admission:  NA (Previous SI, yesterday)  Current Mental Status by Physician: In full contact with reality. There are no active SI plans or intent. She is tolerating her medications well, no side effects. She is planning to stay with a friend and take her time before recommitting to the relationship with her boyfriend. States there are things they have to deal with before she would be ready to get back together. She is going to go back to work at her present job but anticipating to give a 2 weeks notice as admits it is not a good place for her to work at. States she feel so much better   Loss  Factors: NA  Historical Factors: NA  Risk Reduction Factors:   Employed, Living with another person, especially a relative and Positive social support  Continued Clinical Symptoms:  Bipolar Disorder:   Depressive phase  Cognitive Features That Contribute To Risk:  Polarized thinking Thought constriction (tunnel vision)    Suicide Risk:  Minimal: No identifiable suicidal ideation.  Patients presenting with no risk factors but with morbid ruminations; may be classified as minimal risk based on the severity of the depressive symptoms  Discharge Diagnoses:   AXIS I:  Bipolar Depressed, PTSD AXIS II:  No diagnosis AXIS III:   Past Medical History  Diagnosis Date  . Asthma   . RSD (reflex sympathetic dystrophy)   . Ovarian cyst   . Bipolar 1 disorder   . Vertigo   . Anemia   . CRPS (complex regional pain syndrome)    AXIS IV:  other psychosocial or environmental problems AXIS V:  61-70 mild symptoms  Plan Of Care/Follow-up recommendations:  Activity:  as tolerated Diet:  regular Follow up outpatient basis Is patient on multiple antipsychotic therapies at discharge:  No   Has Patient had three or more failed trials of antipsychotic monotherapy by history:  No  Recommended Plan for Multiple Antipsychotic Therapies: NA    Malya Cirillo A 07/31/2014, 1:04 PM

## 2014-07-31 NOTE — Discharge Summary (Signed)
Physician Discharge Summary Note  Patient:  Elizabeth Little is an 23 y.o., female MRN:  [id number] DOB:  [birth year] Patient phone:  [phone] (home)  Patient address:   [address] [place] [address],  Total Time spent with patient: 30 minutes  Date of Admission:  07/25/2014 Date of Discharge: 07/31/14  Reason for Admission:  Mood stabilization  Discharge Diagnoses: Active Problems:   PTSD (post-traumatic stress disorder)   Bipolar I disorder, most recent episode depressed  Psychiatric Specialty Exam: Physical Exam  Psychiatric: She has a normal mood and affect. Her speech is normal and behavior is normal. Judgment and thought content normal. Cognition and memory are normal.    Review of Systems  Constitutional: Negative.   HENT: Negative.   Eyes: Negative.   Respiratory: Negative.   Cardiovascular: Negative.   Gastrointestinal: Negative.   Genitourinary: Negative.   Musculoskeletal: Negative.   Skin: Positive for rash (Present prior to admission, has been improving with Kenalog cream).  Neurological: Negative.   Endo/Heme/Allergies: Negative.   Psychiatric/Behavioral: Positive for depression (Stabilized with medications) and suicidal ideas (Stabilized with medications ).    Blood pressure 115/82, pulse 100, temperature 97.9 F (36.6 C), temperature source Oral, resp. rate 18, height 5' 4.75" (1.645 m), weight 69.4 kg (153 lb), last menstrual period 06/26/2014, SpO2 100 %.Body mass index is 25.65 kg/(m^2).  See Physician SRA     Past Psychiatric History: See H&P Diagnosis:  Hospitalizations:  Outpatient Care:  Substance Abuse Care:  Self-Mutilation:  Suicidal Attempts:  Violent Behaviors:   Musculoskeletal: Strength & Muscle Tone: within normal limits Gait & Station: normal Patient leans: N/A  DSM5: Axis Diagnosis:   AXIS I: Bipolar Depressed, PTSD AXIS II: No diagnosis AXIS III:  Past Medical History  Diagnosis Date  . Asthma    . RSD (reflex sympathetic dystrophy)   . Ovarian cyst   . Bipolar 1 disorder   . Vertigo   . Anemia   . CRPS (complex regional pain syndrome)    AXIS IV: other psychosocial or environmental problems AXIS V: 61-70 mild symptoms  Level of Care:  OP  Hospital Course:   Elizabeth MiloKathryn Lege is a 23 y.o. divorced Caucasian female [facility] following a suicide attempt via ingestion of 2000mg  Seroquel and 5 handfuls of Excedrin PM. The patient is currently under IVC due to her suicide attempt. [facility] the biggest trigger was her boyfriend of 2.5 years breaking up with her. She called a friend after taking the pills who then notified EMS. Patient reported that her depression had been worsening prior to the breakup. She was very tearful throughout her psychiatric exam today stating "I took the pills to kill myself. I did not want to feel anymore physical or emotional pain. I have felt bad for months. I have no self esteem. I don't like looking in the mirror even. I feel so guilty for even being alive. Like I don't deserve to enjoy anything. A few months ago I started to hear voices telling me that I am horrible and should not be alive. My boyfriend got tired of dealing with my depression and kicked me out. Now I may have nowhere to live. I can't afford the apartment rent on my own. Sometimes I go through periods for being super happy for days at a time. That's where all my money goes. I buy things for people but then later I feel paranoid. My mother takes medication for  Bipolar Disorder but I don't know what they are. I really want to get help." The patient appears severely depressed and was tearful during the majority of the assessment. She reports only taking Seroquel 800 mg daily prior to admission. Elizabeth Little appears motivated to receive appropriate treatments to stabilize her mental illness.         Elizabeth Little was admitted to the adult unit where she was  evaluated and her symptoms were identified. Medication management was discussed and implemented. Her medications were adjusted to address her symptoms. Her Seroquel XR was discontinued after the initial assessment was completed. The patient was started on Abilify 5 mg daily for psychosis, Lamictal 25 mg daily and Lithium for improved mood stability.  She was encouraged to participate in unit programming. Medical problems were identified and treated appropriately. Patient was placed on Kenalog cream and Claritin daily for treatment of a rash that was present prior to admission.  Home medication was restarted as needed.  She was evaluated each day by a clinical provider to ascertain the patient's response to treatment.  Improvement was noted by the patient's report of decreasing symptoms, improved sleep and appetite, affect, medication tolerance, behavior, and participation in unit programming.  The patient was asked each day to complete a self inventory noting mood, mental status, pain, new symptoms, anxiety and concerns.         She responded well to medication and being in a therapeutic and supportive environment. Positive and appropriate behavior was noted and the patient was motivated for recovery.  She worked closely with the treatment team and case manager to develop a discharge plan with appropriate goals. Coping skills, problem solving as well as relaxation therapies were also part of the unit programming. Patient was taken off Lithium due to continued complaints of gastrointestinal distress.          By the day of discharge she was in much improved condition than upon admission.  Symptoms were reported as significantly decreased or resolved completely.  The patient denied SI/HI and voiced no AVH. She was motivated to continue taking medication with a goal of continued improvement in mental health. Elizabeth Little was discharged home with a plan to follow up as noted below. Patient was provided with sample  medications and prescriptions at time of discharge. The patient left [facility] in stable condition with all belongings returned.   Consults:  psychiatry  Significant Diagnostic Studies:  Chemistry panel, Lipid profile, Hemoglobin A1c, TSH,   Discharge Vitals:   Blood pressure 115/82, pulse 100, temperature 97.9 F (36.6 C), temperature source Oral, resp. rate 18, height 5' 4.75" (1.645 m), weight 69.4 kg (153 lb), last menstrual period 06/26/2014, SpO2 100 %. Body mass index is 25.65 kg/(m^2). Lab Results:   No results found for this or any previous visit (from the past 72 hour(s)).  Physical Findings: AIMS: Facial and Oral Movements Muscles of Facial Expression: None, normal Lips and Perioral Area: None, normal Jaw: None, normal Tongue: None, normal,Extremity Movements Upper (arms, wrists, hands, fingers): None, normal Lower (legs, knees, ankles, toes): None, normal, Trunk Movements Neck, shoulders, hips: None, normal, Overall Severity Severity of abnormal movements (highest score from questions above): None, normal Incapacitation due to abnormal movements: None, normal Patient's awareness of abnormal movements (rate only patient's report): No Awareness, Dental Status Current problems with teeth and/or dentures?: No Does patient usually wear dentures?: No  CIWA:    COWS:     Psychiatric Specialty Exam: See Psychiatric Specialty Exam and Suicide  Risk Assessment completed by Attending Physician prior to discharge.  Discharge destination:  Home  Is patient on multiple antipsychotic therapies at discharge:  No   Has Patient had three or more failed trials of antipsychotic monotherapy by history:  No  Recommended Plan for Multiple Antipsychotic Therapies: NA  Discharge Instructions    Discharge instructions    Complete by:  As directed   Please follow up with your Primary Care Provider for evaluation of rash to upper body continues or worsens.            Medication List     STOP taking these medications        QUEtiapine 400 MG 24 hr tablet  Commonly known as:  SEROQUEL XR      TAKE these medications      Indication   albuterol 108 (90 BASE) MCG/ACT inhaler  Commonly known as:  PROVENTIL HFA;VENTOLIN HFA  Inhale 2 puffs into the lungs every 6 (six) hours as needed for wheezing.      ARIPiprazole 5 MG tablet  Commonly known as:  ABILIFY  Take 1 tablet (5 mg total) by mouth daily.   Indication:  Major Depressive Disorder, Psychosis     lamoTRIgine 25 MG tablet  Commonly known as:  LAMICTAL  Take 1 tablet (25 mg total) by mouth daily.   Indication:  Depression, Mood lability     zolpidem 10 MG tablet  Commonly known as:  AMBIEN  Take 1 tablet (10 mg total) by mouth at bedtime as needed for sleep.   Indication:  Trouble Sleeping           Follow-up Information    Follow up with Dr. Maeola Sarahunninghanm - [facility] On 08/01/2014.   Why:  You are scheduled with Dr. Tomasa Randunningham on Tuesday, August 01, 2014 at 10:15 AM   Contact information:   [address] [place], [place]   [address]  [phone]      Follow up with Katlin Hecox - [facility] On 08/07/2014.   Why:  You are scheduled with Katlin on Monday, August 07, 2014 at [facility]9AM   Contact information:   [address] [place], [place]  [address]  [phone]       Follow-up recommendations:   Activity: as tolerated Diet: regular Follow up outpatient basis  Comments:   Take all your medications as prescribed by your mental healthcare provider.  Report any adverse effects and or reactions from your medicines to your outpatient provider promptly.  Patient is instructed and cautioned to not engage in alcohol and or illegal drug use while on prescription medicines.  In the event of worsening symptoms, patient is instructed to call the crisis hotline, 911 and or go to the nearest ED for appropriate evaluation and treatment of symptoms.  Follow-up with your  primary care provider for your other medical issues, concerns and or health care needs.   Total Discharge Time:  Greater than 30 minutes.  SignedFransisca Kaufmann: DAVIS, LAURA NP-C 07/31/2014, 10:40 AM  I personally assessed the patient and formulated the plan Madie RenoIrving A. Dub MikesLugo, M.D.

## 2014-07-31 NOTE — [facility] Group Notes (Signed)
   [facility] Note  07/31/2014  8:45 AM   Participation Quality: Alert, Appropriate and Oriented  Mood/Affect: Appropriate  Depression Rating: 0  Anxiety Rating: 7-8  Thoughts of Suicide: Pt denies SI/HI  Will you contract for safety? Yes  Current AVH: Pt denies  Plan for Discharge/Comments: Pt attended discharge planning group and actively participated in group. CSW provided pt with today's workbook. Patient states that she is feeling much better today and that she is adjusting well to her medications. She feels ready for discharge today. She plans to stay with a friend and will follow up with [facility].  Transportation Means: Pt reports access to transportation  Supports: No supports mentioned at this time.  Samuella BruinKristin Zahniya Zellars, MSW, [employer] IncLCSWA Clinical Social Worker [facility] [phone]

## 2014-07-31 NOTE — Progress Notes (Signed)
Patient ID: Elizabeth Little, female   DOB: 1991/07/13, 23 y.o.   MRN: [id number]  Pt. Denies SI/HI and A/V hallucinations. Belongings returned to patient at time of discharge. Patient denies any new onset of pain or discomfort. Discharge instructions and medications were reviewed with patient. Patient verbalized understanding of both medications and discharge instructions. Patient reported she was ready to go! Patient was discharged to lobby where her father was waiting. Q15 minute safety checks were maintained until discharge. No distress noted upon discharge.

## 2014-07-31 NOTE — Progress Notes (Signed)
Patient ID: Elizabeth Little, female   DOB: Mar 11, 1991, 23 y.o.   MRN: [id number] D: Patient alert and cooperative. Pt reports she learned to not let what people say about her get to her. Pt reports plans to use coping skills once discharged. Pt denies SI/HI/AVH. No acute distressed noted at this time.   A: Met with pt 1:1. Medications administered as prescribed. Emotional support given and will continue to monitor pt's progress for stabilization.  R: Patient remains safe and complaint with medications.

## 2014-08-01 NOTE — Progress Notes (Signed)
[facility] Group Notes:  (Nursing/MHT/Case Management/Adjunct)  Date:  08/01/2014  Time:  2:27 AM  Type of Therapy:  Psychoeducational Skills  Participation Level:  Active  Participation Quality:  Appropriate  Affect:  Appropriate  Cognitive:  Appropriate  Insight:  Appropriate  Engagement in Group:  Engaged  Modes of Intervention:  Activity  Summary of Progress/Problems:  Elizabeth Little 08/01/2014, 2:27 AM 

## 2014-08-03 NOTE — Progress Notes (Signed)
Patient Discharge Instructions:  After Visit Summary (AVS):   Faxed to:  08/03/14 Discharge Summary Note:   Faxed to:  08/03/14 Psychiatric Admission Assessment Note:   Faxed to:  08/03/14 Suicide Risk Assessment - Discharge Assessment:   Faxed to:  08/03/14 Faxed/Sent to the Next Level Care provider:  08/03/14 Faxed to [facility] @ [phone] [facility] @ [phone]  Jerelene ReddenSheena E [place], 08/03/2014, 2:40 PM

## 2014-10-20 ENCOUNTER — Ambulatory Visit: Payer: Self-pay | Admitting: Family Medicine

## 2014-10-20 ENCOUNTER — Ambulatory Visit: Payer: Self-pay

## 2014-11-03 ENCOUNTER — Ambulatory Visit (INDEPENDENT_AMBULATORY_CARE_PROVIDER_SITE_OTHER): Payer: 59

## 2014-11-03 ENCOUNTER — Ambulatory Visit (INDEPENDENT_AMBULATORY_CARE_PROVIDER_SITE_OTHER): Payer: 59 | Admitting: Family Medicine

## 2014-11-03 ENCOUNTER — Encounter: Payer: Self-pay | Admitting: Family Medicine

## 2014-11-03 VITALS — BP 114/70 | HR 91 | Temp 98.1°F | Resp 18 | Ht 66.0 in | Wt 157.0 lb

## 2014-11-03 DIAGNOSIS — R590 Localized enlarged lymph nodes: Secondary | ICD-10-CM

## 2014-11-03 DIAGNOSIS — G4700 Insomnia, unspecified: Secondary | ICD-10-CM

## 2014-11-03 DIAGNOSIS — F3162 Bipolar disorder, current episode mixed, moderate: Secondary | ICD-10-CM

## 2014-11-03 LAB — COMPREHENSIVE METABOLIC PANEL
ALK PHOS: 79 U/L (ref 39–117)
ALT: 19 U/L (ref 0–35)
AST: 16 U/L (ref 0–37)
Albumin: 4.4 g/dL (ref 3.5–5.2)
BILIRUBIN TOTAL: 0.4 mg/dL (ref 0.2–1.2)
BUN: 11 mg/dL (ref 6–23)
CO2: 23 mEq/L (ref 19–32)
CREATININE: 0.82 mg/dL (ref 0.50–1.10)
Calcium: 9.3 mg/dL (ref 8.4–10.5)
Chloride: 107 mEq/L (ref 96–112)
GLUCOSE: 95 mg/dL (ref 70–99)
Potassium: 4.3 mEq/L (ref 3.5–5.3)
Sodium: 140 mEq/L (ref 135–145)
Total Protein: 7.1 g/dL (ref 6.0–8.3)

## 2014-11-03 LAB — CBC
HEMATOCRIT: 37.5 % (ref 36.0–46.0)
HEMOGLOBIN: 12.2 g/dL (ref 12.0–15.0)
MCH: 27.5 pg (ref 26.0–34.0)
MCHC: 32.5 g/dL (ref 30.0–36.0)
MCV: 84.5 fL (ref 78.0–100.0)
MPV: 9.8 fL (ref 8.6–12.4)
Platelets: 313 10*3/uL (ref 150–400)
RBC: 4.44 MIL/uL (ref 3.87–5.11)
RDW: 14.4 % (ref 11.5–15.5)
WBC: 6.6 10*3/uL (ref 4.0–10.5)

## 2014-11-03 MED ORDER — ZOLPIDEM TARTRATE 10 MG PO TABS: ORAL_TABLET | ORAL | Status: DC

## 2014-11-03 NOTE — Progress Notes (Signed)
[facility] [address], [place] [address] [phone]  Date:  11/03/2014   Name:  Elizabeth Little   DOB:  October 14, 1990   MRN:  [id number]  PCP:  No PCP Per Patient    Chief Complaint: Anxiety and Adenopathy   History of Present Illness:  Elizabeth Little is a 24 y.o. very pleasant female patient who presents with the following:  Here today to discuss a swollen, tender area under her right arm.  This has been present for about 3 weeks.  She states that the swelling goes up and down, but the pain has been present consistently.    She was admitted to [facility] from 12/8 to 07/31/2014 with PTSD and Bipolar 1.  At that time she had a suicide attempt by medication overdose.  She was discharged home with abilify, lamictal, ambien and was scheduled to follow-up with Dr. Tomasa Rand and a therapist, Ms. Hecox.   She was last seen by Dr. Tomasa Rand in January.  She was changed to risperdal from abilify recently after a phone consultation, and her dose of lamictal was increased.  She is now on  of risperdal  She had been on Ambien but ran out- she ran out shortly after her hospital stay.   She feels "like there's this weight on my chest, I can't breathe.  It's been so bad lately that I throw up- that's the only thing that makes the panic attack better."  She has noted more intense symptoms for about 6 weeks.   She has not seen her counselor in approx 2 months, has not been able to keep an appt because "life has been a little crazy."   She states that her main concern is that she cannot sleep; she may sleep for 2 hours, but will wake up in a sweat and with a racing heart. "It's like that jolt awake."  She is not sure if she might be manic right now- "my mania does not normally feel like this." a few weeks ago she was spending some money, but not now. She feels like she might be getting more into a depression phase.    She did reconcile with her BF while she was inpt and she is living  with him again.  "That's like the only that's been going good."  Her father is supportive and lives nearby, but she has not talked to her mother in 6 months She works at a [employer], in the Psychologist, sport and exercise.   She states that she is not currently suicidal.  Her mother is an alcoholic; pt states that she does not drink alcohol or use drugs   Patient Active Problem List   Diagnosis Date Noted  . Bipolar I disorder, most recent episode depressed   . PTSD (post-traumatic stress disorder) 07/25/2014  . Bipolar disorder, current episode mixed, severe, with psychotic features   . Asthma 11/11/2012  . Dysmenorrhea 11/11/2012    Past Medical History  Diagnosis Date  . Asthma   . RSD (reflex sympathetic dystrophy)   . Ovarian cyst   . Bipolar 1 disorder   . Vertigo   . Anemia   . CRPS (complex regional pain syndrome)     Past Surgical History  Procedure Laterality Date  . Cholecystectomy      History  Substance Use Topics  . Smoking status: Former Games developer  . Smokeless tobacco: Never Used  . Alcohol Use: 0.0 oz/week    0 Standard drinks or equivalent per week  Comment: Rarely, Less than monthly    Family History  Problem Relation Age of Onset  . Mental illness Mother   . Mental illness Father   . Mental illness Brother   . Asthma Brother     Allergies  Allergen Reactions  . Mint Chocolate Chip Flavor [Flavoring Agent] Swelling and Rash    Pt reports "I am allergic to mint. I get a rash and swollen lips. I need an epi-pen if I eat mint."  . Dilaudid [Hydromorphone Hcl]   . Percocet [Oxycodone-Acetaminophen]   . Subutex [Buprenorphine]     Medication list has been reviewed and updated.  Current Outpatient Prescriptions on File Prior to Visit  Medication Sig Dispense Refill  . albuterol (PROVENTIL HFA;VENTOLIN HFA) 108 (90 BASE) MCG/ACT inhaler Inhale 2 puffs into the lungs every 6 (six) hours as needed for wheezing. 1 Inhaler 0  . ARIPiprazole (ABILIFY) 5 MG tablet Take 1  tablet (5 mg total) by mouth daily. (Patient not taking: Reported on 11/03/2014) 30 tablet 0  . lamoTRIgine (LAMICTAL) 25 MG tablet Take 1 tablet (25 mg total) by mouth daily. (Patient not taking: Reported on 11/03/2014) 30 tablet 0  . zolpidem (AMBIEN) 10 MG tablet Take 1 tablet (10 mg total) by mouth at bedtime as needed for sleep. (Patient not taking: Reported on 11/03/2014) 15 tablet 0   No current facility-administered medications on file prior to visit.    Review of Systems:  As per HPI- otherwise negative.   Physical Examination: Filed Vitals:   11/03/14 0839  BP: 114/70  Pulse: 91  Temp: 98.1 F (36.7 C)  Resp: 18   Filed Vitals:   11/03/14 0839  Height:  (1.676 m)  Weight: 157 lb (71.215 kg)   Body mass index is 25.35 kg/(m^2). Ideal Body Weight: Weight in (lb) to have BMI = 25: 154.6  GEN: WDWN, NAD, Non-toxic, A & O x 3, appears healthy, tearful HEENT: Atraumatic, Normocephalic. Neck supple. No masses, No LAD. Ears and Nose: No external deformity. CV: RRR, No M/G/R. No JVD. No thrill. No extra heart sounds. PULM: CTA B, no wheezes, crackles, rhonchi. No retractions. No resp. distress. No accessory muscle use. ABD: S, NT, ND, +BS. No rebound. No HSM. EXTR: No c/c/e NEURO Normal gait.  PSYCH: Normally interactive. Conversant, tearful but calmed with interview  She has 3 or 4 small, shotty nodes in her right axilla.  These are tender Bilateral breast exam is normal, she has fibrous feeling tissue consistent with her young age but no masses or dimpling/ discharge  [facility] reading (PRIMARY) by  Dr. Patsy Lager. CXR: negative  CHEST 2 VIEW  COMPARISON: None.  FINDINGS: Normal lung volumes. Normal cardiac size and mediastinal contours. Visualized tracheal air column is within normal limits. No pneumothorax, pulmonary edema, pleural effusion or confluent pulmonary opacity. No osseous abnormality identified. No axillary soft tissue abnormality is  evident.  IMPRESSION: Negative, no acute cardiopulmonary abnormality.  Assessment and Plan: Lymphadenopathy, axillary - Plan: CBC, Comprehensive metabolic panel, DG Chest 2 View, MM Digital Diagnostic Bilat  Bipolar 1 disorder, mixed, moderate  Insomnia - Plan: zolpidem (AMBIEN) 10 MG tablet  Reassured that her axillary nodes are likely benign, but I would recommend that we do a mammogram as they have been present for a few weeks.  Also await her other labs as above Called Dr. Tomasa Rand and spoke with him on the phone. He is ok with me giving her ambien for sleep.  He also advised that she may  increase her risperdal to 3mg  at this time.  I encouraged her to schedule and appt to see him and her therapist soon.  Also encouraged a healthy, stable routine with exercise, healthy food and consistent sleep and wake times She may use ambien for sleep; encouraged her to start with 5 mg only  Signed Abbe AmsterdamJessica Greenly Rarick, MD

## 2014-11-03 NOTE — Patient Instructions (Signed)
We will be in touch with you regarding your labs and your mammogram appointment.  Let me know if you have any changes in your symptoms  You can use an ambien before bed to help you sleep. Remember to go right to bed after taking this medication!  Per Dr. Tomasa Randunningham you can go up on your risperdal to 3mg  a day.  Please call and schedule a follow-up with him and with your counselor ASAP Try to keep to a stable daily routine, and eat good food/ get some exercise. These steps will help to improve your mood stability Value yourself and take care of yourself!

## 2014-11-06 ENCOUNTER — Other Ambulatory Visit: Payer: Self-pay | Admitting: Family Medicine

## 2014-11-06 DIAGNOSIS — R590 Localized enlarged lymph nodes: Secondary | ICD-10-CM

## 2014-12-01 ENCOUNTER — Encounter: Payer: Self-pay | Admitting: Family Medicine

## 2014-12-01 ENCOUNTER — Ambulatory Visit (INDEPENDENT_AMBULATORY_CARE_PROVIDER_SITE_OTHER): Payer: 59 | Admitting: Family Medicine

## 2014-12-01 VITALS — BP 113/75 | HR 81 | Temp 97.5°F | Resp 16 | Ht 66.0 in | Wt 151.0 lb

## 2014-12-01 DIAGNOSIS — G4700 Insomnia, unspecified: Secondary | ICD-10-CM | POA: Diagnosis not present

## 2014-12-01 DIAGNOSIS — F3131 Bipolar disorder, current episode depressed, mild: Secondary | ICD-10-CM

## 2014-12-01 DIAGNOSIS — Z79899 Other long term (current) drug therapy: Secondary | ICD-10-CM

## 2014-12-01 DIAGNOSIS — Z3009 Encounter for other general counseling and advice on contraception: Secondary | ICD-10-CM

## 2014-12-01 DIAGNOSIS — J4520 Mild intermittent asthma, uncomplicated: Secondary | ICD-10-CM | POA: Diagnosis not present

## 2014-12-01 DIAGNOSIS — F419 Anxiety disorder, unspecified: Secondary | ICD-10-CM

## 2014-12-01 DIAGNOSIS — T38805S Adverse effect of unspecified hormones and synthetic substitutes, sequela: Secondary | ICD-10-CM | POA: Diagnosis not present

## 2014-12-01 DIAGNOSIS — G90521 Complex regional pain syndrome I of right lower limb: Secondary | ICD-10-CM

## 2014-12-01 DIAGNOSIS — N8320 Unspecified ovarian cysts: Secondary | ICD-10-CM

## 2014-12-01 DIAGNOSIS — IMO0001 Reserved for inherently not codable concepts without codable children: Secondary | ICD-10-CM

## 2014-12-01 DIAGNOSIS — N83209 Unspecified ovarian cyst, unspecified side: Secondary | ICD-10-CM | POA: Insufficient documentation

## 2014-12-01 LAB — HEMOGLOBIN A1C
Hgb A1c MFr Bld: 5.5 % (ref <5.7)
Mean Plasma Glucose: 111 mg/dL (ref <117)

## 2014-12-01 MED ORDER — LEVONORGESTREL 0.75 MG PO TABS: 1.5000 mg | ORAL_TABLET | Freq: Once | ORAL | Status: DC

## 2014-12-01 MED ORDER — ONDANSETRON 4 MG PO TBDP: 4.0000 mg | ORAL_TABLET | Freq: Three times a day (TID) | ORAL | Status: DC | PRN

## 2014-12-01 MED ORDER — ALBUTEROL SULFATE HFA 108 (90 BASE) MCG/ACT IN AERS: 2.0000 | INHALATION_SPRAY | Freq: Four times a day (QID) | RESPIRATORY_TRACT | Status: AC | PRN

## 2014-12-01 MED ORDER — CLONAZEPAM 1 MG PO TABS: 1.0000 mg | ORAL_TABLET | Freq: Every day | ORAL | Status: DC

## 2014-12-01 NOTE — Progress Notes (Signed)
Subjective:    Patient ID: Elizabeth Little, female    DOB: 1990-12-15, 24 y.o.   MRN: [id number] This chart was scribed for Norberto Sorenson, MD by Littie Deeds, Medical Scribe. This patient was seen in Room 25 and the patient's care was started at 11:20 AM.   Chief Complaint  Patient presents with  . Establish care    HPI HPI Comments: Elizabeth Little is a 24 y.o. female with a hx of asthma, allergies, complex regional pain syndrome (right leg), PTSD, and bipolar I disorder who [facility] to [facility] care.   She was in the behavioral health hospital 5 months ago in December. She had a suicidal attempt from drug overdosing. She has been seen at our urgent care clinic next door several times. Last time was one month ago when she had right axillary painful lymphadenopathy for 3 weeks. She was supposed to follow up with Dr. Tomasa Rand, psychiatrist, and Ms. Hecox, therapist, but she had not seen them in several months.  Lymphadenopathy: Her swelling has decreased, but she does occasionally feel some sharp pain. This has improved significantly. She has not felt any breast lumps at home.  Psychiatric: Patient has been seeing Dr. Tomasa Rand on and off for 3 years. She has not seen any other psychiatrists in the area. She has not seen her counselor in a while. Patient recently started Lexapro 4 days ago; since then, she has had some decreased appetite. She does having occasional panic attack, during which she has difficulty breathing. She notes having wheezing after panic attacks.  Asthma: Patient does note having flare-ups of her asthma. Her boyfriend of 3 years, who she lives with, has a nebulizer machine which she will use sometimes, about once a week. She does not have an inhaler, but would like to have one. She notes that sometimes she will get light-winded when going up the stairs (she lives on the 3rd floor). Patient does not do any formal exercise other than going up  stairs, but she does note having some wheezing after physical activity.  Cough: Patient does report having cough and nasal congestion that started today. She does have seasonal allergies, for which she takes Benadryl occasionally.  Complex Regional Pain Syndrome: She was diagnosed with complex regional pain syndrome in her right leg when she was 16. Initially, she had been in a wheelchair and was told she may not walk again, but she is able to walk. She had been seeing several specialists, including a neurologist, for this in [place], where she was living at the time. Patient does not have a neurologist currently. She has not had any problems with recurrence and is able to go up stairs. She does note ankle swelling and joint pain in her knee and hip which is worsened with cold weather. Patient typically takes Advil or aspirin for pain.  Women's Health: Patient does not have a gynecologist currently and has not seen a gynecologist in this state. Her last pap smear was when she was 25, before she had a Mirena IUD put in at the age of 57. She notes that the IUD shifted and she had to have it surgically removed. Patient is not on any birth control currently, but wants a prescription for plan B. She does report using condoms with sexual intercourse. She notes that when she was on birth control pills in the past, the pills gave her a menstrual period every week and had caused more cysts to form.  Patient is not thinking about pregnancy.  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 She recently moved to the area 3 years ago because her family is here. [place]. She got married and ended up living in [place], but that did not work out so she moved back here.  Past Medical History  Diagnosis Date  . Asthma   . RSD (reflex sympathetic dystrophy)   . Ovarian cyst   . Bipolar 1 disorder   . Vertigo   . Anemia   . CRPS (complex regional pain syndrome)   . Anxiety     . Depression    Past Surgical History  Procedure Laterality Date  . Cholecystectomy     Current Outpatient Prescriptions on File Prior to Visit  Medication Sig Dispense Refill  . lamoTRIgine (LAMICTAL) 200 MG tablet Take 200 mg by mouth daily.    . risperiDONE (RISPERDAL) 1 MG tablet Take 4 mg by mouth at bedtime. Takes 1-2 tablets once a day     No current facility-administered medications on file prior to visit.   Allergies  Allergen Reactions  . Mint Chocolate Chip Flavor [Flavoring Agent] Swelling and Rash    Pt reports "I am allergic to mint. I get a rash and swollen lips. I need an epi-pen if I eat mint."  . Dilaudid [Hydromorphone Hcl]   . Percocet [Oxycodone-Acetaminophen]   . Subutex [Buprenorphine]    Family History  Problem Relation Age of Onset  . Mental illness Mother   . Mental illness Father   . Mental illness Brother   . Asthma Brother    History   Social History  . Marital Status: Married    Spouse Name: N/A  . Number of Children: N/A  . Years of Education: N/A   Social History Main Topics  . Smoking status: Former Games developermoker  . Smokeless tobacco: Never Used  . Alcohol Use: 0.0 oz/week    0 Standard drinks or equivalent per week     Comment: Rarely, Less than monthly  . Drug Use: Yes    Special: Marijuana  . Sexual Activity: Yes    Birth Control/ Protection: None   Other Topics Concern  . None   Social History Narrative     Review of Systems  Constitutional: Positive for appetite change. Negative for unexpected weight change.  HENT: Positive for congestion and postnasal drip. Negative for rhinorrhea, sore throat, trouble swallowing and voice change.   Respiratory: Positive for cough, chest tightness, shortness of breath and wheezing.   Cardiovascular: Negative for chest pain and leg swelling.  Gastrointestinal: Positive for nausea. Negative for vomiting and abdominal pain.  Genitourinary: Positive for menstrual problem. Negative for  hematuria, vaginal bleeding, vaginal discharge, genital sores, pelvic pain and dyspareunia.  Musculoskeletal: Positive for myalgias, joint swelling and arthralgias. Negative for gait problem.  Allergic/Immunologic: Positive for environmental allergies. Negative for food allergies and immunocompromised state.  Hematological: Positive for adenopathy.  Psychiatric/Behavioral: Positive for sleep disturbance, dysphoric mood and agitation. Negative for suicidal ideas and confusion. The patient is nervous/anxious.        Objective:  BP 113/75 mmHg  Pulse 81  Temp(Src) 97.5 F (36.4 C) (Oral)  Resp 16  Ht 5\' 6"  (1.676 m)  Wt 151 lb (68.493 kg)  BMI 24.38 kg/m2  SpO2 100%  LMP 11/02/2014  Physical Exam  Constitutional: She is oriented to person, place, and time. She appears well-developed and well-nourished. No distress.  HENT:  Head:  Normocephalic and atraumatic.  Right Ear: Tympanic membrane normal.  Left Ear: Tympanic membrane normal.  Mouth/Throat: Posterior oropharyngeal edema and posterior oropharyngeal erythema present. No oropharyngeal exudate.  Oropharynx with mild erythema.  Eyes: Pupils are equal, round, and reactive to light.  Neck: Neck supple.  Cardiovascular: Normal rate, regular rhythm and normal heart sounds.   No murmur heard. Pulmonary/Chest: Effort normal and breath sounds normal. No respiratory distress. She has no wheezes. She has no rales.  Musculoskeletal: She exhibits no edema.  Neurological: She is alert and oriented to person, place, and time. No cranial nerve deficit.  Skin: Skin is warm and dry. No rash noted.  Psychiatric: She has a normal mood and affect. Her behavior is normal.  Vitals reviewed.         Assessment & Plan:  Will fax lab results to pt's psychiatrist at [phone], Dr. Tiajuana Amass.   1. Asthma, mild intermittent, uncomplicated - use albuterol before exercise and when ill - using boyfriends nebulizer  2. Cyst of ovary, unspecified  laterality - pt is due for pap but will defer to gyn since referring due to complicated hx.  3. Anxiety   4. Insomnia - try clonazepam - due to h/o OD and multiple meds will need to keep close track of refills to ensure using appropriately  5. Family planning counseling - pt did not tolerate OCPs, IUD, or implant - made ovarian cysts worse with more metromenorrhagia so will refer to gynecology for further eval as to what type of birth control would be best - pt is sexually active and HAS to be on some type of birth control due to prescribed meds so will ask Maryelizabeth Rowan with [facility] to see pt.  6. Adverse reaction to hormonal drug, sequela   7. Polypharmacy   8. Mild depressed bipolar I disorder with anxious distress - followed by Dr. Tiajuana Amass for sev yrs, recently in [facility] due to SI by OD - now doing well, working and living w/ boyfriend - [facility] with [facility]. Wellington Hampshire  9. Complex Regional Pain Syndrome - diagnosed at 24 yo in [place] - was non-ambulatory due to right leg pain, has not seen any neurologists or pain doctors locally as has not had exacerbation of sxs in several yrs    Orders Placed This Encounter  Procedures  . Lipid panel    Order Specific Question:  Has the patient fasted?    Answer:  Yes  . Hemoglobin A1c  . hCG, serum, qualitative  . Ambulatory referral to Gynecology    Referral Priority:  Routine    Referral Type:  Consultation    Referral Reason:  Specialty Services Required    Requested Specialty:  Gynecology    Number of Visits Requested:  1    Meds ordered this encounter  Medications  . escitalopram (LEXAPRO) 20 MG tablet    Sig: Take 20 mg by mouth daily.  Marland Kitchen albuterol (PROVENTIL HFA;VENTOLIN HFA) 108 (90 BASE) MCG/ACT inhaler    Sig: Inhale 2 puffs into the lungs every 6 (six) hours as needed for wheezing.    Dispense:  1 Inhaler    Refill:  0  . clonazePAM (KLONOPIN) 1 MG tablet    Sig: Take 1  tablet (1 mg total) by mouth at bedtime.    Dispense:  30 tablet    Refill:  0  . levonorgestrel (PLAN B) 0.75 MG tablet    Sig: Take 2  tablets (1.5 mg total) by mouth once.    Dispense:  2 tablet    Refill:  11    Repeat dose if vomits when 3 hours of taking it  . ondansetron (ZOFRAN ODT) 4 MG disintegrating tablet    Sig: Take 1 tablet (4 mg total) by mouth every 8 (eight) hours as needed for nausea or vomiting.    Dispense:  20 tablet    Refill:  0    I personally performed the services described in this documentation, which was scribed in my presence. The recorded information has been reviewed and considered, and addended by me as needed.  Norberto Sorenson, MD MPH   Results for orders placed or performed in visit on 12/01/14  Lipid panel  Result Value Ref Range   Cholesterol 149 0 - 200 mg/dL   Triglycerides 34 <161 mg/dL   HDL 62 >=09 mg/dL   Total CHOL/HDL Ratio 2.4 Ratio   VLDL 7 0 - 40 mg/dL   LDL Cholesterol 80 0 - 99 mg/dL  Hemoglobin U0A  Result Value Ref Range   Hgb A1c MFr Bld 5.5 <5.7 %   Mean Plasma Glucose 111 <117 mg/dL  hCG, serum, qualitative  Result Value Ref Range   Preg, Serum NEG

## 2014-12-01 NOTE — Patient Instructions (Addendum)
[facility] Wellington Hampshirehris Aiken - they just [facility] up [facility].  Contraceptive Injection Information Contraceptive injections protect against pregnancy. Progesterone-only injections are given every 3 months to prevent pregnancy. These injections contain synthetic progesterone hormone. This synthetic progesterone hormone stops the ovaries from releasing eggs. It also thickens cervical mucus and changes the uterine lining. Your health care provider will make sure you are a good candidate for contraceptive injections. Discuss the possible side effects of the injection with your health care provider. ADVANTAGES  They are highly effective and reversible.  They slow down the flow of heavy menstrual periods.  They control cramps and painful menstrual periods.  Some women no longer get their period.  They are effective in preventing pregnancy when used correctly.  You are always protected from getting pregnant when you get the injection on time. DISADVANTAGES  They can be associated with weight gain and irregular bleeding.  They do not protect against sexually transmitted diseases (STDs).  You must visit your health care provider every 3 months.  The injections may be uncomfortable.  They may cost more than other methods of birth control.  It can take between 6 months and 2 years to be able to get pregnant (fertility).  They may also cause bone loss. Document Released: 07/24/2011 Document Revised: 04/06/2013 Document Reviewed: 02/01/2013 [facility] Patient Information 2015 [place]. This information is not intended to replace advice given to you by your health care provider. Make sure you discuss any questions you have with your health care provider.   Contraception Choices Contraception (birth control) is the use of any methods or devices to prevent pregnancy. Below are some methods to help avoid pregnancy. HORMONAL METHODS    Contraceptive implant. This is a thin, plastic tube containing progesterone hormone. It does not contain estrogen hormone. Your health care provider inserts the tube in the inner part of the upper arm. The tube can remain in place for up to 3 years. After 3 years, the implant must be removed. The implant prevents the ovaries from releasing an egg (ovulation), thickens the cervical mucus to prevent sperm from entering the uterus, and thins the lining of the inside of the uterus.  Progesterone-only injections. These injections are given every 3 months by your health care provider to prevent pregnancy. This synthetic progesterone hormone stops the ovaries from releasing eggs. It also thickens cervical mucus and changes the uterine lining. This makes it harder for sperm to survive in the uterus.  Birth control pills. These pills contain estrogen and progesterone hormone. They work by preventing the ovaries from releasing eggs (ovulation). They also cause the cervical mucus to thicken, preventing the sperm from entering the uterus. Birth control pills are prescribed by a health care provider.Birth control pills can also be used to treat heavy periods.  Minipill. This type of birth control pill contains only the progesterone hormone. They are taken every day of each month and must be prescribed by your health care provider.  Birth control patch. The patch contains hormones similar to those in birth control pills. It must be changed once a week and is prescribed by a health care provider.  Vaginal ring. The ring contains hormones similar to those in birth control pills. It is left in the vagina for 3 weeks, removed for 1 week, and then a new one is put back in place. The patient must be comfortable inserting and removing the ring from the vagina.A health care  provider's prescription is necessary.  Emergency contraception. Emergency contraceptives prevent pregnancy after unprotected sexual intercourse.  This pill can be taken right after sex or up to 5 days after unprotected sex. It is most effective the sooner you take the pills after having sexual intercourse. Most emergency contraceptive pills are available without a prescription. Check with your pharmacist. Do not use emergency contraception as your only form of birth control. BARRIER METHODS   Female condom. This is a thin sheath (latex or rubber) that is worn over the penis during sexual intercourse. It can be used with spermicide to increase effectiveness.  Female condom. This is a soft, loose-fitting sheath that is put into the vagina before sexual intercourse.  Diaphragm. This is a soft, latex, dome-shaped barrier that must be fitted by a health care provider. It is inserted into the vagina, along with a spermicidal jelly. It is inserted before intercourse. The diaphragm should be left in the vagina for 6 to 8 hours after intercourse.  Cervical cap. This is a round, soft, latex or plastic cup that fits over the cervix and must be fitted by a health care provider. The cap can be left in place for up to 48 hours after intercourse.  Sponge. This is a soft, circular piece of polyurethane foam. The sponge has spermicide in it. It is inserted into the vagina after wetting it and before sexual intercourse.  Spermicides. These are chemicals that kill or block sperm from entering the cervix and uterus. They come in the form of creams, jellies, suppositories, foam, or tablets. They do not require a prescription. They are inserted into the vagina with an applicator before having sexual intercourse. The process must be repeated every time you have sexual intercourse. INTRAUTERINE CONTRACEPTION  Intrauterine device (IUD). This is a T-shaped device that is put in a woman's uterus during a menstrual period to prevent pregnancy. There are 2 types:  Copper IUD. This type of IUD is wrapped in copper wire and is placed inside the uterus. Copper makes the  uterus and fallopian tubes produce a fluid that kills sperm. It can stay in place for 10 years.  Hormone IUD. This type of IUD contains the hormone progestin (synthetic progesterone). The hormone thickens the cervical mucus and prevents sperm from entering the uterus, and it also thins the uterine lining to prevent implantation of a fertilized egg. The hormone can weaken or kill the sperm that get into the uterus. It can stay in place for 3-5 years, depending on which type of IUD is used. PERMANENT METHODS OF CONTRACEPTION  Female tubal ligation. This is when the woman's fallopian tubes are surgically sealed, tied, or blocked to prevent the egg from traveling to the uterus.  Hysteroscopic sterilization. This involves placing a small coil or insert into each fallopian tube. Your doctor uses a technique called hysteroscopy to do the procedure. The device causes scar tissue to form. This results in permanent blockage of the fallopian tubes, so the sperm cannot fertilize the egg. It takes about 3 months after the procedure for the tubes to become blocked. You must use another form of birth control for these 3 months.  Female sterilization. This is when the female has the tubes that carry sperm tied off (vasectomy).This blocks sperm from entering the vagina during sexual intercourse. After the procedure, the man can still ejaculate fluid (semen). NATURAL PLANNING METHODS  Natural family planning. This is not having sexual intercourse or using a barrier method (condom, diaphragm, cervical cap) on  days the woman could become pregnant.  Calendar method. This is keeping track of the length of each menstrual cycle and identifying when you are fertile.  Ovulation method. This is avoiding sexual intercourse during ovulation.  Symptothermal method. This is avoiding sexual intercourse during ovulation, using a thermometer and ovulation symptoms.  Post-ovulation method. This is timing sexual intercourse after you  have ovulated. Regardless of which type or method of contraception you choose, it is important that you use condoms to protect against the transmission of sexually transmitted infections (STIs). Talk with your health care provider about which form of contraception is most appropriate for you. Document Released: 08/04/2005 Document Revised: 08/09/2013 Document Reviewed: 01/27/2013 [facility] Patient Information 2015 [place], [place]. This information is not intended to replace advice given to you by your health care provider. Make sure you discuss any questions you have with your health care provider.   Medroxyprogesterone injection [Contraceptive] What is this medicine? MEDROXYPROGESTERONE (me DROX ee proe JES te rone) contraceptive injections prevent pregnancy. They provide effective birth control for 3 months. Depo-subQ Provera 104 is also used for treating pain related to endometriosis. This medicine may be used for other purposes; ask your health care provider or pharmacist if you have questions. COMMON BRAND NAME(S): Depo-Provera, Depo-subQ Provera 104 What should I tell my health care provider before I take this medicine? They need to know if you have any of these conditions: -frequently drink alcohol -asthma -blood vessel disease or a history of a blood clot in the lungs or legs -bone disease such as osteoporosis -breast cancer -diabetes -eating disorder (anorexia nervosa or bulimia) -high blood pressure -HIV infection or AIDS -kidney disease -liver disease -mental depression -migraine -seizures (convulsions) -stroke -tobacco smoker -vaginal bleeding -an unusual or allergic reaction to medroxyprogesterone, other hormones, medicines, foods, dyes, or preservatives -pregnant or trying to get pregnant -breast-feeding How should I use this medicine? Depo-Provera Contraceptive injection is given into a muscle. Depo-subQ Provera 104 injection is given under the skin. These injections  are given by a health care professional. You must not be pregnant before getting an injection. The injection is usually given during the first 5 days after the start of a menstrual period or 6 weeks after delivery of a baby. Talk to your pediatrician regarding the use of this medicine in children. Special care may be needed. These injections have been used in female children who have started having menstrual periods. Overdosage: If you think you have taken too much of this medicine contact a poison control center or emergency room at once. NOTE: This medicine is only for you. Do not share this medicine with others. What if I miss a dose? Try not to miss a dose. You must get an injection once every 3 months to maintain birth control. If you cannot keep an appointment, call and reschedule it. If you wait longer than 13 weeks between Depo-Provera contraceptive injections or longer than 14 weeks between Depo-subQ Provera 104 injections, you could get pregnant. Use another method for birth control if you miss your appointment. You may also need a pregnancy test before receiving another injection. What may interact with this medicine? Do not take this medicine with any of the following medications: -bosentan This medicine may also interact with the following medications: -aminoglutethimide -antibiotics or medicines for infections, especially rifampin, rifabutin, rifapentine, and griseofulvin -aprepitant -barbiturate medicines such as phenobarbital or primidone -bexarotene -carbamazepine -medicines for seizures like ethotoin, felbamate, oxcarbazepine, phenytoin, topiramate -modafinil -St. John's wort This list may not  describe all possible interactions. Give your health care provider a list of all the medicines, herbs, non-prescription drugs, or dietary supplements you use. Also tell them if you smoke, drink alcohol, or use illegal drugs. Some items may interact with your medicine. What should I watch  for while using this medicine? This drug does not protect you against HIV infection (AIDS) or other sexually transmitted diseases. Use of this product may cause you to lose calcium from your bones. Loss of calcium may cause weak bones (osteoporosis). Only use this product for more than 2 years if other forms of birth control are not right for you. The longer you use this product for birth control the more likely you will be at risk for weak bones. Ask your health care professional how you can keep strong bones. You may have a change in bleeding pattern or irregular periods. Many females stop having periods while taking this drug. If you have received your injections on time, your chance of being pregnant is very low. If you think you may be pregnant, see your health care professional as soon as possible. Tell your health care professional if you want to get pregnant within the next year. The effect of this medicine may last a long time after you get your last injection. What side effects may I notice from receiving this medicine? Side effects that you should report to your doctor or health care professional as soon as possible: -allergic reactions like skin rash, itching or hives, swelling of the face, lips, or tongue -breast tenderness or discharge -breathing problems -changes in vision -depression -feeling faint or lightheaded, falls -fever -pain in the abdomen, chest, groin, or leg -problems with balance, talking, walking -unusually weak or tired -yellowing of the eyes or skin Side effects that usually do not require medical attention (report to your doctor or health care professional if they continue or are bothersome): -acne -fluid retention and swelling -headache -irregular periods, spotting, or absent periods -temporary pain, itching, or skin reaction at site where injected -weight gain This list may not describe all possible side effects. Call your doctor for medical advice about  side effects. You may report side effects to FDA at 1-800-FDA-1088. Where should I keep my medicine? This does not apply. The injection will be given to you by a health care professional. NOTE: This sheet is a summary. It may not cover all possible information. If you have questions about this medicine, talk to your doctor, pharmacist, or health care provider.  2015, [employer]. (2008-08-25 18:37:56)  Insomnia Insomnia is frequent trouble falling and/or staying asleep. Insomnia can be a long term problem or a short term problem. Both are common. Insomnia can be a short term problem when the wakefulness is related to a certain stress or worry. Long term insomnia is often related to ongoing stress during waking hours and/or poor sleeping habits. Overtime, sleep deprivation itself can make the problem worse. Every little thing feels more severe because you are overtired and your ability to cope is decreased. CAUSES   Stress, anxiety, and depression.  Poor sleeping habits.  Distractions such as TV in the bedroom.  Naps close to bedtime.  Engaging in emotionally charged conversations before bed.  Technical reading before sleep.  Alcohol and other sedatives. They may make the problem worse. They can hurt normal sleep patterns and normal dream activity.  Stimulants such as caffeine for several hours prior to bedtime.  Pain syndromes and shortness of breath can cause insomnia.  Exercise late at night.  Changing time zones may cause sleeping problems (jet lag). It is sometimes helpful to have someone observe your sleeping patterns. They should look for periods of not breathing during the night (sleep apnea). They should also look to see how long those periods last. If you live alone or observers are uncertain, you can also be observed at a sleep clinic where your sleep patterns will be professionally monitored. Sleep apnea requires a checkup and treatment. Give your caregivers your  medical history. Give your caregivers observations your family has made about your sleep.  SYMPTOMS   Not feeling rested in the morning.  Anxiety and restlessness at bedtime.  Difficulty falling and staying asleep. TREATMENT   Your caregiver may prescribe treatment for an underlying medical disorders. Your caregiver can give advice or help if you are using alcohol or other drugs for self-medication. Treatment of underlying problems will usually eliminate insomnia problems.  Medications can be prescribed for short time use. They are generally not recommended for lengthy use.  Over-the-counter sleep medicines are not recommended for lengthy use. They can be habit forming.  You can promote easier sleeping by making lifestyle changes such as:  Using relaxation techniques that help with breathing and reduce muscle tension.  Exercising earlier in the day.  Changing your diet and the time of your last meal. No night time snacks.  Establish a regular time to go to bed.  Counseling can help with stressful problems and worry.  Soothing music and white noise may be helpful if there are background noises you cannot remove.  Stop tedious detailed work at least one hour before bedtime. HOME CARE INSTRUCTIONS   Keep a diary. Inform your caregiver about your progress. This includes any medication side effects. See your caregiver regularly. Take note of:  Times when you are asleep.  Times when you are awake during the night.  The quality of your sleep.  How you feel the next day. This information will help your caregiver care for you.  Get out of bed if you are still awake after 15 minutes. Read or do some quiet activity. Keep the lights down. Wait until you feel sleepy and go back to bed.  Keep regular sleeping and waking hours. Avoid naps.  Exercise regularly.  Avoid distractions at bedtime. Distractions include watching television or engaging in any intense or detailed activity  like attempting to balance the household checkbook.  Develop a bedtime ritual. Keep a familiar routine of bathing, brushing your teeth, climbing into bed at the same time each night, listening to soothing music. Routines increase the success of falling to sleep faster.  Use relaxation techniques. This can be using breathing and muscle tension release routines. It can also include visualizing peaceful scenes. You can also help control troubling or intruding thoughts by keeping your mind occupied with boring or repetitive thoughts like the old concept of counting sheep. You can make it more creative like imagining planting one beautiful flower after another in your backyard garden.  During your day, work to eliminate stress. When this is not possible use some of the previous suggestions to help reduce the anxiety that accompanies stressful situations. MAKE SURE YOU:   Understand these instructions.  Will watch your condition.  Will get help right away if you are not doing well or get worse. Document Released: 08/01/2000 Document Revised: 10/27/2011 Document Reviewed: 09/01/2007 [facility] Patient Information 2015 [place], [place]. This information is not intended to replace advice given to you  by your health care provider. Make sure you discuss any questions you have with your health care provider.

## 2014-12-02 LAB — LIPID PANEL
Cholesterol: 149 mg/dL (ref 0–200)
HDL: 62 mg/dL (ref >=46)
LDL CALC: 80 mg/dL (ref 0–99)
TRIGLYCERIDES: 34 mg/dL (ref <150)
Total CHOL/HDL Ratio: 2.4 Ratio
VLDL: 7 mg/dL (ref 0–40)

## 2014-12-02 LAB — HCG, SERUM, QUALITATIVE: Preg, Serum: NEGATIVE

## 2014-12-04 ENCOUNTER — Encounter: Payer: Self-pay | Admitting: Family Medicine

## 2014-12-04 DIAGNOSIS — G90529 Complex regional pain syndrome I of unspecified lower limb: Secondary | ICD-10-CM | POA: Insufficient documentation

## 2014-12-04 DIAGNOSIS — T38805A Adverse effect of unspecified hormones and synthetic substitutes, initial encounter: Secondary | ICD-10-CM | POA: Insufficient documentation

## 2014-12-09 ENCOUNTER — Emergency Department (HOSPITAL_COMMUNITY): Payer: 59

## 2014-12-09 ENCOUNTER — Emergency Department (HOSPITAL_COMMUNITY)
Admission: EM | Admit: 2014-12-09 | Discharge: 2014-12-10 | Disposition: A | Discharge status: Home / Self Care | Payer: 59 | Attending: Emergency Medicine | Admitting: Emergency Medicine

## 2014-12-09 ENCOUNTER — Encounter (HOSPITAL_COMMUNITY): Payer: Self-pay | Admitting: *Deleted

## 2014-12-09 DIAGNOSIS — J45909 Unspecified asthma, uncomplicated: Secondary | ICD-10-CM | POA: Insufficient documentation

## 2014-12-09 DIAGNOSIS — Z3202 Encounter for pregnancy test, result negative: Secondary | ICD-10-CM | POA: Insufficient documentation

## 2014-12-09 DIAGNOSIS — Z9049 Acquired absence of other specified parts of digestive tract: Secondary | ICD-10-CM | POA: Insufficient documentation

## 2014-12-09 DIAGNOSIS — F319 Bipolar disorder, unspecified: Secondary | ICD-10-CM | POA: Insufficient documentation

## 2014-12-09 DIAGNOSIS — Z8742 Personal history of other diseases of the female genital tract: Secondary | ICD-10-CM | POA: Diagnosis not present

## 2014-12-09 DIAGNOSIS — R1030 Lower abdominal pain, unspecified: Secondary | ICD-10-CM | POA: Diagnosis present

## 2014-12-09 DIAGNOSIS — Z79899 Other long term (current) drug therapy: Secondary | ICD-10-CM | POA: Diagnosis not present

## 2014-12-09 DIAGNOSIS — F419 Anxiety disorder, unspecified: Secondary | ICD-10-CM | POA: Diagnosis not present

## 2014-12-09 DIAGNOSIS — Z862 Personal history of diseases of the blood and blood-forming organs and certain disorders involving the immune mechanism: Secondary | ICD-10-CM | POA: Insufficient documentation

## 2014-12-09 DIAGNOSIS — Z8669 Personal history of other diseases of the nervous system and sense organs: Secondary | ICD-10-CM | POA: Insufficient documentation

## 2014-12-09 DIAGNOSIS — K5900 Constipation, unspecified: Secondary | ICD-10-CM | POA: Insufficient documentation

## 2014-12-09 DIAGNOSIS — Z87891 Personal history of nicotine dependence: Secondary | ICD-10-CM | POA: Insufficient documentation

## 2014-12-09 LAB — URINALYSIS, ROUTINE W REFLEX MICROSCOPIC
Glucose, UA: NEGATIVE mg/dL
HGB URINE DIPSTICK: NEGATIVE
Ketones, ur: 15 mg/dL — AB
Nitrite: NEGATIVE
Urobilinogen, UA: 1 mg/dL (ref 0.0–1.0)
pH: 7.5 (ref 5.0–8.0)

## 2014-12-09 LAB — PREGNANCY, URINE: PREG TEST UR: NEGATIVE

## 2014-12-09 LAB — COMPREHENSIVE METABOLIC PANEL
ALBUMIN: 4.9 g/dL (ref 3.5–5.2)
ALK PHOS: 73 U/L (ref 39–117)
ALT: 16 U/L (ref 0–35)
ANION GAP: 11 (ref 5–15)
AST: 21 U/L (ref 0–37)
BILIRUBIN TOTAL: 0.7 mg/dL (ref 0.3–1.2)
BUN: 13 mg/dL (ref 6–23)
CHLORIDE: 103 mmol/L (ref 96–112)
CO2: 23 mmol/L (ref 19–32)
Calcium: 9.2 mg/dL (ref 8.4–10.5)
Creatinine, Ser: 1.05 mg/dL (ref 0.50–1.10)
GFR calc Af Amer: 86 mL/min — ABNORMAL LOW (ref >90)
GFR calc non Af Amer: 74 mL/min — ABNORMAL LOW (ref >90)
Glucose, Bld: 123 mg/dL — ABNORMAL HIGH (ref 70–99)
POTASSIUM: 3.7 mmol/L (ref 3.5–5.1)
SODIUM: 137 mmol/L (ref 135–145)
TOTAL PROTEIN: 8.3 g/dL (ref 6.0–8.3)

## 2014-12-09 LAB — CBC WITH DIFFERENTIAL/PLATELET
BASOS PCT: 0 % (ref 0–1)
Basophils Absolute: 0 10*3/uL (ref 0.0–0.1)
Eosinophils Absolute: 0 10*3/uL (ref 0.0–0.7)
Eosinophils Relative: 0 % (ref 0–5)
HCT: 38.9 % (ref 36.0–46.0)
Hemoglobin: 12.7 g/dL (ref 12.0–15.0)
LYMPHS ABS: 0.8 10*3/uL (ref 0.7–4.0)
LYMPHS PCT: 7 % — AB (ref 12–46)
MCH: 28.2 pg (ref 26.0–34.0)
MCHC: 32.6 g/dL (ref 30.0–36.0)
MCV: 86.3 fL (ref 78.0–100.0)
MONO ABS: 0.3 10*3/uL (ref 0.1–1.0)
MONOS PCT: 3 % (ref 3–12)
NEUTROS ABS: 10.1 10*3/uL — AB (ref 1.7–7.7)
Neutrophils Relative %: 90 % — ABNORMAL HIGH (ref 43–77)
Platelets: 301 10*3/uL (ref 150–400)
RBC: 4.51 MIL/uL (ref 3.87–5.11)
RDW: 14 % (ref 11.5–15.5)
WBC: 11.2 10*3/uL — AB (ref 4.0–10.5)

## 2014-12-09 LAB — URINE MICROSCOPIC-ADD ON

## 2014-12-09 MED ORDER — IOHEXOL 300 MG/ML  SOLN
100.0000 mL | Freq: Once | INTRAMUSCULAR | Status: AC | PRN
  Administered 2014-12-09: 100 mL via INTRAVENOUS

## 2014-12-09 MED ORDER — SODIUM CHLORIDE 0.9 % IV BOLUS (SEPSIS)
1000.0000 mL | Freq: Once | INTRAVENOUS | Status: AC
  Administered 2014-12-09: 1000 mL via INTRAVENOUS

## 2014-12-09 MED ORDER — ONDANSETRON HCL 4 MG/2ML IJ SOLN
4.0000 mg | Freq: Once | INTRAMUSCULAR | Status: AC
  Administered 2014-12-09: 4 mg via INTRAVENOUS
  Filled 2014-12-09: qty 2

## 2014-12-09 MED ORDER — FENTANYL CITRATE (PF) 100 MCG/2ML IJ SOLN
100.0000 ug | Freq: Once | INTRAMUSCULAR | Status: AC
  Administered 2014-12-09: 100 ug via INTRAVENOUS
  Filled 2014-12-09: qty 2

## 2014-12-09 MED ORDER — IOHEXOL 300 MG/ML  SOLN
50.0000 mL | Freq: Once | INTRAMUSCULAR | Status: AC | PRN
  Administered 2014-12-09: 50 mL via ORAL

## 2014-12-09 MED ORDER — PROMETHAZINE HCL 25 MG/ML IJ SOLN
25.0000 mg | Freq: Once | INTRAMUSCULAR | Status: AC
  Administered 2014-12-09: 25 mg via INTRAVENOUS
  Filled 2014-12-09: qty 1

## 2014-12-09 NOTE — ED Notes (Addendum)
Pt reports severe low abd pain today with nausea.  Pt reports she does not recall when was the last time she had a BM.  Pt reports taking plan-B twice last week.

## 2014-12-09 NOTE — ED Provider Notes (Signed)
CSN: [id number]     Arrival date & time 12/09/14  1747 History   First MD Initiated Contact with Patient 12/09/14 1857     Chief Complaint  Patient presents with  . Abdominal Pain  . Constipation     (Consider location/radiation/quality/duration/timing/severity/associated sxs/prior Treatment) HPI   Elizabeth Little is a 24 yo female with Bipolar Disorder I and Anxiety who presents to the ED complaining of lower abdominal pain and nausea/vomiting since last night. She also has not had a bowel movement in about a week. She took a laxative at home today with no results. She says the abdominal pain has grown worse over the day and describes it as a sharp pain. She endorses subjective fever/chills and vertigo and palpitations today as well. She denies changes in her hearing or tinnitus. She denies headache, syncope, cough, SOB, chest pain, diarrhea, dysuria, vaginal discharge, pain or pruritus. She has not urinated since this morning although she says she has been drinking lots of water and juice today.   She states that she took Plan B twice this week twice because she threw up within the first three hours of taking it the first time. She is sexually active with a long-term partner and does not desire STI testing today.   Past Medical History  Diagnosis Date  . Asthma   . RSD (reflex sympathetic dystrophy)   . Ovarian cyst   . Bipolar 1 disorder   . Vertigo   . Anemia   . CRPS (complex regional pain syndrome)   . Anxiety   . Depression    Past Surgical History  Procedure Laterality Date  . Cholecystectomy     Family History  Problem Relation Age of Onset  . Mental illness Mother   . Mental illness Father   . Mental illness Brother   . Asthma Brother    History  Substance Use Topics  . Smoking status: Former Games developer  . Smokeless tobacco: Never Used  . Alcohol Use: 0.0 oz/week    0 Standard drinks or equivalent per week     Comment: Rarely, Less than monthly   OB History    Gravida  Para Term Preterm AB TAB SAB Ectopic Multiple Living       Review of Systems  All other systems negative except as documented in the HPI. All pertinent positives and negatives as reviewed in the HPI.   Allergies  Mint chocolate chip flavor; Dilaudid; Percocet; and Subutex  Home Medications   Prior to Admission medications   Medication Sig Start Date End Date Taking? Authorizing Provider  albuterol (PROVENTIL HFA;VENTOLIN HFA) 108 (90 BASE) MCG/ACT inhaler Inhale 2 puffs into the lungs every 6 (six) hours as needed for wheezing. 12/01/14  Yes Sherren Mocha, MD  bisacodyl (DULCOLAX) 5 MG EC tablet Take 5 mg by mouth daily as needed for mild constipation or moderate constipation (constipation).   Yes Historical Provider, MD  clonazePAM (KLONOPIN) 1 MG tablet Take 1 tablet (1 mg total) by mouth at bedtime. 12/01/14  Yes Sherren Mocha, MD  escitalopram (LEXAPRO) 20 MG tablet Take 20 mg by mouth at bedtime.    Yes Historical Provider, MD  lamoTRIgine (LAMICTAL) 200 MG tablet Take 200 mg by mouth at bedtime.    Yes Historical Provider, MD  ondansetron (ZOFRAN ODT) 4 MG disintegrating tablet Take 1 tablet (4 mg total) by mouth every 8 (eight) hours as needed for nausea or vomiting.  12/01/14  Yes Sherren MochaEva N Shaw, MD  risperidone (RISPERDAL) 4 MG tablet Take 4 mg by mouth at bedtime.  11/29/14  Yes Historical Provider, MD  levonorgestrel (PLAN B) 0.75 MG tablet Take 2 tablets (1.5 mg total) by mouth once. Patient not taking: Reported on 12/09/2014 12/01/14   Sherren MochaEva N Shaw, MD   BP 120/74 mmHg  Pulse 73  Temp(Src) 98.1 F (36.7 C) (Oral)  Resp 20  SpO2 100%  LMP 11/02/2014 Physical Exam  Constitutional: She is oriented to person, place, and time. She appears well-developed and well-nourished. No distress.  HENT:  Head: Normocephalic and atraumatic.  Mouth/Throat: Oropharynx is clear and moist and mucous membranes are normal.  Eyes: Conjunctivae are normal. Pupils are equal, round, and  reactive to light.  Neck: Normal range of motion. Neck supple. No thyromegaly present.  Cardiovascular: Normal rate, regular rhythm, S1 normal, S2 normal and normal heart sounds.  Exam reveals no gallop and no friction rub.   No murmur heard. Pulmonary/Chest: Effort normal and breath sounds normal. No respiratory distress.  Abdominal: Soft. Normal appearance and bowel sounds are normal. There is no hepatosplenomegaly. There is tenderness in the right lower quadrant and left lower quadrant. There is no guarding.  Musculoskeletal: She exhibits no edema.  Neurological: She is alert and oriented to person, place, and time. She exhibits normal muscle tone. Coordination normal.  Skin: Skin is warm and dry.  Psychiatric: Her speech is normal and behavior is normal. Thought content normal.  Nursing note and vitals reviewed.   ED Course  Procedures (including critical care time) Labs Review Labs Reviewed  CBC WITH DIFFERENTIAL/PLATELET - Abnormal; Notable for the following:    WBC 11.2 (*)    Neutrophils Relative % 90 (*)    Neutro Abs 10.1 (*)    Lymphocytes Relative 7 (*)    All other components within normal limits  COMPREHENSIVE METABOLIC PANEL - Abnormal; Notable for the following:    Glucose, Bld 123 (*)    GFR calc non Af Amer 74 (*)    GFR calc Af Amer 86 (*)    All other components within normal limits  URINALYSIS, ROUTINE W REFLEX MICROSCOPIC - Abnormal; Notable for the following:    Color, Urine RED (*)    Specific Gravity, Urine >1.030 (*)    Bilirubin Urine SMALL (*)    Ketones, ur 15 (*)    Protein, ur TRACE (*)    Leukocytes, UA SMALL (*)    All other components within normal limits  URINE MICROSCOPIC-ADD ON - Abnormal; Notable for the following:    Squamous Epithelial / LPF FEW (*)    All other components within normal limits  PREGNANCY, URINE    Imaging Review Ct Abdomen Pelvis W Contrast  12/10/2014   CLINICAL DATA:  Acute onset of severe lower abdominal pain and  nausea. Initial encounter.  EXAM: CT ABDOMEN AND PELVIS WITH CONTRAST  TECHNIQUE: Multidetector CT imaging of the abdomen and pelvis was performed using the standard protocol following bolus administration of intravenous contrast.  CONTRAST:  50mL OMNIPAQUE IOHEXOL 300 MG/ML SOLN, 100mL OMNIPAQUE IOHEXOL 300 MG/ML SOLN  COMPARISON:  Pelvic ultrasound performed 10/26/2012, and abdominal radiograph performed earlier today at 8:37 p.m.  FINDINGS: Mild bibasilar atelectasis is noted.  The liver and spleen are unremarkable in appearance. The patient is status post cholecystectomy, with clips noted at the gallbladder fossa. The pancreas and adrenal glands are unremarkable.  The kidneys are unremarkable in appearance. There is no evidence  of hydronephrosis. No renal or ureteral stones are seen. No perinephric stranding is appreciated.  No free fluid is identified. The small bowel is unremarkable in appearance. The stomach is within normal limits. No acute vascular abnormalities are seen.  The appendix is not definitely seen; there is no evidence of appendicitis. The colon is partially filled with stool, particularly at the sigmoid colon, and is unremarkable in appearance.  A metallic piercing is noted at the umbilicus.  The bladder is relatively decompressed and not well assessed. The uterus is unremarkable in appearance. The ovaries are relatively symmetric. No suspicious adnexal masses are seen. No inguinal lymphadenopathy is seen.  No acute osseous abnormalities are identified.  IMPRESSION: 1. No acute abnormality seen within the abdomen and pelvis 2. Sigmoid colon largely filled with stool, which may reflect mild constipation, though the remainder of the colon is unremarkable. 3. Mild bibasilar atelectasis noted.   Electronically Signed   By: Roanna Raider M.D.   On: 12/10/2014 00:28   Dg Abd Acute W/chest  12/09/2014   CLINICAL DATA:  Nausea, vomiting, and constipation. Symptoms for a few days, worse today.  EXAM:  DG ABDOMEN ACUTE W/ 1V CHEST  COMPARISON:  Flat erect abdomen 04/21/2014. Chest radiograph 11/03/2014.  FINDINGS: There is no evidence of dilated bowel loops or free intraperitoneal air. No radiopaque calculi or other significant radiographic abnormality is seen. Heart size and mediastinal contours are within normal limits. Both lungs are clear. Sequelae of prior cholecystectomy.  IMPRESSION: Negative abdominal radiographs.  No acute cardiopulmonary disease.   Electronically Signed   By: Davonna Belling M.D.   On: 12/09/2014 20:53    Patient be treated for constipation.  Told to return here as needed.  Advised to follow-up with a primary care Dr. Catalina Pizza to return here as needed.    Charlestine Night, PA-C 12/10/14 0104  Lorre Nick, MD 12/10/14 1539

## 2014-12-09 NOTE — ED Notes (Signed)
Pt unable to urinate at this this time.

## 2014-12-09 NOTE — ED Notes (Signed)
When I entered room, pt stated she was very dizzy and nauseous and thought she was going to pass out. Pt laid back and was provided emesis bag. Pt also stated that when she was laying back and opened her eyes the room was spinning.

## 2014-12-09 NOTE — ED Notes (Signed)
Pt went to bathroom but said she could not urinate.

## 2014-12-10 MED ORDER — LACTULOSE 10 GM/15ML PO SOLN: 10.0000 g | Freq: Two times a day (BID) | ORAL | Status: DC

## 2014-12-10 MED ORDER — DOCUSATE SODIUM 100 MG PO CAPS: 100.0000 mg | ORAL_CAPSULE | Freq: Two times a day (BID) | ORAL | Status: DC

## 2014-12-10 MED ORDER — BISACODYL 10 MG RE SUPP: 10.0000 mg | RECTAL | Status: DC | PRN

## 2014-12-10 NOTE — Discharge Instructions (Signed)
Your CT scan showed constipation.  Return here as needed.  Follow-up with your primary care doctor, increase your fluid intake and rest as much as possible

## 2014-12-12 ENCOUNTER — Ambulatory Visit (INDEPENDENT_AMBULATORY_CARE_PROVIDER_SITE_OTHER): Payer: 59 | Admitting: Women's Health

## 2014-12-12 ENCOUNTER — Encounter: Payer: Self-pay | Admitting: Women's Health

## 2014-12-12 VITALS — BP 120/80 | Ht 66.0 in | Wt 159.0 lb

## 2014-12-12 DIAGNOSIS — N946 Dysmenorrhea, unspecified: Secondary | ICD-10-CM

## 2014-12-12 DIAGNOSIS — Z01419 Encounter for gynecological examination (general) (routine) without abnormal findings: Secondary | ICD-10-CM | POA: Diagnosis not present

## 2014-12-12 DIAGNOSIS — Z30011 Encounter for initial prescription of contraceptive pills: Secondary | ICD-10-CM

## 2014-12-12 MED ORDER — IBUPROFEN 600 MG PO TABS: 600.0000 mg | ORAL_TABLET | Freq: Three times a day (TID) | ORAL | Status: DC | PRN

## 2014-12-12 MED ORDER — NORGESTIMATE-ETH ESTRADIOL 0.25-35 MG-MCG PO TABS: 1.0000 | ORAL_TABLET | Freq: Every day | ORAL | Status: DC

## 2014-12-12 NOTE — Patient Instructions (Signed)
Constipation  Constipation is when a person has fewer than three bowel movements a week, has difficulty having a bowel movement, or has stools that are dry, hard, or larger than normal. As people grow older, constipation is more common. If you try to fix constipation with medicines that make you have a bowel movement (laxatives), the problem may get worse. Long-term laxative use may cause the muscles of the colon to become weak. A low-fiber diet, not taking in enough fluids, and taking certain medicines may make constipation worse.   CAUSES   · Certain medicines, such as antidepressants, pain medicine, iron supplements, antacids, and water pills.    · Certain diseases, such as diabetes, irritable bowel syndrome (IBS), thyroid disease, or depression.    · Not drinking enough water.    · Not eating enough fiber-rich foods.    · Stress or travel.    · Lack of physical activity or exercise.    · Ignoring the urge to have a bowel movement.    · Using laxatives too much.    SIGNS AND SYMPTOMS   · Having fewer than three bowel movements a week.    · Straining to have a bowel movement.    · Having stools that are hard, dry, or larger than normal.    · Feeling full or bloated.    · Pain in the lower abdomen.    · Not feeling relief after having a bowel movement.    DIAGNOSIS   Your health care provider will take a medical history and perform a physical exam. Further testing may be done for severe constipation. Some tests may include:  · A barium enema X-ray to examine your rectum, colon, and, sometimes, your small intestine.    · A sigmoidoscopy to examine your lower colon.    · A colonoscopy to examine your entire colon.  TREATMENT   Treatment will depend on the severity of your constipation and what is causing it. Some dietary treatments include drinking more fluids and eating more fiber-rich foods. Lifestyle treatments may include regular exercise. If these diet and lifestyle recommendations do not help, your health care  provider may recommend taking over-the-counter laxative medicines to help you have bowel movements. Prescription medicines may be prescribed if over-the-counter medicines do not work.   HOME CARE INSTRUCTIONS   · Eat foods that have a lot of fiber, such as fruits, vegetables, whole grains, and beans.  · Limit foods high in fat and processed sugars, such as french fries, hamburgers, cookies, candies, and soda.    · A fiber supplement may be added to your diet if you cannot get enough fiber from foods.    · Drink enough fluids to keep your urine clear or pale yellow.    · Exercise regularly or as directed by your health care provider.    · Go to the restroom when you have the urge to go. Do not hold it.    · Only take over-the-counter or prescription medicines as directed by your health care provider. Do not take other medicines for constipation without talking to your health care provider first.    SEEK IMMEDIATE MEDICAL CARE IF:   · You have bright red blood in your stool.    · Your constipation lasts for more than 4 days or gets worse.    · You have abdominal or rectal pain.    · You have thin, pencil-like stools.    · You have unexplained weight loss.  MAKE SURE YOU:   · Understand these instructions.  · Will watch your condition.  · Will get help right away if you are not   you have with your health care provider. Health Maintenance Adopting a healthy lifestyle and getting preventive care can go a long way to promote health and wellness. Talk with your health care provider about what schedule of regular examinations is right for you. This is a good chance for you to check in with your provider about disease prevention and  staying healthy. In between checkups, there are plenty of things you can do on your own. Experts have done a lot of research about which lifestyle changes and preventive measures are most likely to keep you healthy. Ask your health care provider for more information. WEIGHT AND DIET  Eat a healthy diet  Be sure to include plenty of vegetables, fruits, low-fat dairy products, and lean protein.  Do not eat a lot of foods high in solid fats, added sugars, or salt.  Get regular exercise. This is one of the most important things you can do for your health.  Most adults should exercise for at least 150 minutes each week. The exercise should increase your heart rate and make you sweat (moderate-intensity exercise).  Most adults should also do strengthening exercises at least twice a week. This is in addition to the moderate-intensity exercise.  Maintain a healthy weight  Body mass index (BMI) is a measurement that can be used to identify possible weight problems. It estimates body fat based on height and weight. Your health care provider can help determine your BMI and help you achieve or maintain a healthy weight.  For females 73 years of age and older:   A BMI below 18.5 is considered underweight.  A BMI of 18.5 to 24.9 is normal.  A BMI of 25 to 29.9 is considered overweight.  A BMI of 30 and above is considered obese.  Watch levels of cholesterol and blood lipids  You should start having your blood tested for lipids and cholesterol at 24 years of age, then have this test every 5 years.  You may need to have your cholesterol levels checked more often if:  Your lipid or cholesterol levels are high.  You are older than 24 years of age.  You are at high risk for heart disease.  CANCER SCREENING   Lung Cancer  Lung cancer screening is recommended for adults 86-79 years old who are at high risk for lung cancer because of a history of smoking.  A yearly low-dose CT scan of the  lungs is recommended for people who:  Currently smoke.  Have quit within the past 15 years.  Have at least a 30-pack-year history of smoking. A pack year is smoking an average of one pack of cigarettes a day for 1 year.  Yearly screening should continue until it has been 15 years since you quit.  Yearly screening should stop if you develop a health problem that would prevent you from having lung cancer treatment.  Breast Cancer  Practice breast self-awareness. This means understanding how your breasts normally appear and feel.  It also means doing regular breast self-exams. Let your health care provider know about any changes, no matter how small.  If you are in your 20s or 30s, you should have a clinical breast exam (CBE) by a health care provider every 1-3 years as part of a regular health exam.  If you are 27 or older, have a CBE every year. Also consider having a breast X-ray (mammogram) every year.  If you have a family history of breast cancer, talk to your health care  provider about genetic screening.  If you are at high risk for breast cancer, talk to your health care provider about having an MRI and a mammogram every year.  Breast cancer gene (BRCA) assessment is recommended for women who have family members with BRCA-related cancers. BRCA-related cancers include:  Breast.  Ovarian.  Tubal.  Peritoneal cancers.  Results of the assessment will determine the need for genetic counseling and BRCA1 and BRCA2 testing. Cervical Cancer Routine pelvic examinations to screen for cervical cancer are no longer recommended for nonpregnant women who are considered low risk for cancer of the pelvic organs (ovaries, uterus, and vagina) and who do not have symptoms. A pelvic examination may be necessary if you have symptoms including those associated with pelvic infections. Ask your health care provider if a screening pelvic exam is right for you.   The Pap test is the screening test  for cervical cancer for women who are considered at risk.  If you had a hysterectomy for a problem that was not cancer or a condition that could lead to cancer, then you no longer need Pap tests.  If you are older than 65 years, and you have had normal Pap tests for the past 10 years, you no longer need to have Pap tests.  If you have had past treatment for cervical cancer or a condition that could lead to cancer, you need Pap tests and screening for cancer for at least 20 years after your treatment.  If you no longer get a Pap test, assess your risk factors if they change (such as having a new sexual partner). This can affect whether you should start being screened again.  Some women have medical problems that increase their chance of getting cervical cancer. If this is the case for you, your health care provider may recommend more frequent screening and Pap tests.  The human papillomavirus (HPV) test is another test that may be used for cervical cancer screening. The HPV test looks for the virus that can cause cell changes in the cervix. The cells collected during the Pap test can be tested for HPV.  The HPV test can be used to screen women 30 years of age and older. Getting tested for HPV can extend the interval between normal Pap tests from three to five years.  An HPV test also should be used to screen women of any age who have unclear Pap test results.  After 24 years of age, women should have HPV testing as often as Pap tests.  Colorectal Cancer  This type of cancer can be detected and often prevented.  Routine colorectal cancer screening usually begins at 24 years of age and continues through 24 years of age.  Your health care provider may recommend screening at an earlier age if you have risk factors for colon cancer.  Your health care provider may also recommend using home test kits to check for hidden blood in the stool.  A small camera at the end of a tube can be used to  examine your colon directly (sigmoidoscopy or colonoscopy). This is done to check for the earliest forms of colorectal cancer.  Routine screening usually begins at age 50.  Direct examination of the colon should be repeated every 5-10 years through 24 years of age. However, you may need to be screened more often if early forms of precancerous polyps or small growths are found. Skin Cancer  Check your skin from head to toe regularly.  Tell your health care   provider about any new moles or changes in moles, especially if there is a change in a mole's shape or color.  Also tell your health care provider if you have a mole that is larger than the size of a pencil eraser.  Always use sunscreen. Apply sunscreen liberally and repeatedly throughout the day.  Protect yourself by wearing long sleeves, pants, a wide-brimmed hat, and sunglasses whenever you are outside. HEART DISEASE, DIABETES, AND HIGH BLOOD PRESSURE   Have your blood pressure checked at least every 1-2 years. High blood pressure causes heart disease and increases the risk of stroke.  If you are between 55 years and 79 years old, ask your health care provider if you should take aspirin to prevent strokes.  Have regular diabetes screenings. This involves taking a blood sample to check your fasting blood sugar level.  If you are at a normal weight and have a low risk for diabetes, have this test once every three years after 24 years of age.  If you are overweight and have a high risk for diabetes, consider being tested at a younger age or more often. PREVENTING INFECTION  Hepatitis B  If you have a higher risk for hepatitis B, you should be screened for this virus. You are considered at high risk for hepatitis B if:  You were born in a country where hepatitis B is common. Ask your health care provider which countries are considered high risk.  Your parents were born in a high-risk country, and you have not been immunized against  hepatitis B (hepatitis B vaccine).  You have HIV or AIDS.  You use needles to inject street drugs.  You live with someone who has hepatitis B.  You have had sex with someone who has hepatitis B.  You get hemodialysis treatment.  You take certain medicines for conditions, including cancer, organ transplantation, and autoimmune conditions. Hepatitis C  Blood testing is recommended for:  Everyone born from 1945 through 1965.  Anyone with known risk factors for hepatitis C. Sexually transmitted infections (STIs)  You should be screened for sexually transmitted infections (STIs) including gonorrhea and chlamydia if:  You are sexually active and are younger than 24 years of age.  You are older than 24 years of age and your health care provider tells you that you are at risk for this type of infection.  Your sexual activity has changed since you were last screened and you are at an increased risk for chlamydia or gonorrhea. Ask your health care provider if you are at risk.  If you do not have HIV, but are at risk, it may be recommended that you take a prescription medicine daily to prevent HIV infection. This is called pre-exposure prophylaxis (PrEP). You are considered at risk if:  You are sexually active and do not regularly use condoms or know the HIV status of your partner(s).  You take drugs by injection.  You are sexually active with a partner who has HIV. Talk with your health care provider about whether you are at high risk of being infected with HIV. If you choose to begin PrEP, you should first be tested for HIV. You should then be tested every 3 months for as long as you are taking PrEP.  PREGNANCY   If you are premenopausal and you may become pregnant, ask your health care provider about preconception counseling.  If you may become pregnant, take 400 to 800 micrograms (mcg) of folic acid every day.  If you   want to prevent pregnancy, talk to your health care provider  about birth control (contraception). OSTEOPOROSIS AND MENOPAUSE   Osteoporosis is a disease in which the bones lose minerals and strength with aging. This can result in serious bone fractures. Your risk for osteoporosis can be identified using a bone density scan.  If you are 65 years of age or older, or if you are at risk for osteoporosis and fractures, ask your health care provider if you should be screened.  Ask your health care provider whether you should take a calcium or vitamin D supplement to lower your risk for osteoporosis.  Menopause may have certain physical symptoms and risks.  Hormone replacement therapy may reduce some of these symptoms and risks. Talk to your health care provider about whether hormone replacement therapy is right for you.  HOME CARE INSTRUCTIONS   Schedule regular health, dental, and eye exams.  Stay current with your immunizations.   Do not use any tobacco products including cigarettes, chewing tobacco, or electronic cigarettes.  If you are pregnant, do not drink alcohol.  If you are breastfeeding, limit how much and how often you drink alcohol.  Limit alcohol intake to no more than 1 drink per day for nonpregnant women. One drink equals 12 ounces of beer, 5 ounces of wine, or 1 ounces of hard liquor.  Do not use street drugs.  Do not share needles.  Ask your health care provider for help if you need support or information about quitting drugs.  Tell your health care provider if you often feel depressed.  Tell your health care provider if you have ever been abused or do not feel safe at home. Document Released: 02/17/2011 Document Revised: 12/19/2013 Document Reviewed: 07/06/2013 Exit[employer] Patient Information 2015 [employer]. This information is not intended to replace advice given to you by your health care provider. Make sure you discuss any questions you have with your health care provider.  

## 2014-12-12 NOTE — Progress Notes (Signed)
Shellee MiloKathryn Debo 01-26-1991 [id number]    History:    Presents for annual exam.  Monthly cycle/condoms. Has had numerous problems with contraception,  Nexplanon in 2014 bled most days of the year removed in 2015. Had an IUD that had to be surgically removed. Irregular bleeding with OCs in the past. Use Plan B last week and started bleeding today. Same partner 3 years, denies need for STD screen. Negative STD screen 2014. Has had problems with constipation. Bipolar on numerous medications per psychiatrist.   Past medical history, past surgical history, family history and social history were all reviewed and documented in the EPIC chart. Poor relationship with mother, good relationship with other. Lives with boyfriend who she reports as being supportive. Tearful at times when talking, affect flat.  ROS:  A ROS was performed and pertinent positives and negatives are included.  Exam:  Filed Vitals:   12/12/14 1421  BP: 120/80    General appearance:  Normal, numerous tattoos Thyroid:  Symmetrical, normal in size, without palpable masses or nodularity. Respiratory  Auscultation:  Clear without wheezing or rhonchi Cardiovascular  Auscultation:  Regular rate, without rubs, murmurs or gallops  Edema/varicosities:  Not grossly evident Abdominal  Soft,nontender, without masses, guarding or rebound.  Liver/spleen:  No organomegaly noted  Hernia:  None appreciated  Skin  Inspection:  Grossly normal   Breasts: Examined lying and sitting.     Right: Without masses, retractions, discharge or axillary adenopathy.     Left: Without masses, retractions, discharge or axillary adenopathy. Gentitourinary   Inguinal/mons:  Normal without inguinal adenopathy  External genitalia:  Normal  BUS/Urethra/Skene's glands:  Normal  Vagina:  Normal copious menses  Cervix:  Normal  Uterus:   normal in size, shape and contour.  Midline and mobile  Adnexa/parametria:     Rt: Without masses or  tenderness.   Lt: Without masses or tenderness.  Anus and perineum: Normal    Assessment/Plan:  24 y.o. S WF G0 for annual exam.    Contraception management Labs-primary care Bipolar-psychiatrist manages medications  Plan: Contraception options reviewed, will try OCs again, had trouble remembering when younger. Ortho-Cyclen prescription, proper use, start today, condoms especially first month. Unsure if had gardasil, [place], will check and if did not receive  return to office for series. SBE's, regular exercise, calcium rich diet, MVI daily encouraged. Strongly encouraged to increase counseling, continue psychiatrist for medications. UA, no Pap, menses, normal Pap in the past. Motrin 600 every 8 hours as needed for dysmenorrhea, prescription, proper use given and reviewed.   Harrington ChallengerYOUNG,Delcenia Inman J Uh [facility] HospitalWHNP, 5:08 PM 12/12/2014

## 2014-12-13 ENCOUNTER — Ambulatory Visit: Payer: 59 | Admitting: Women's Health

## 2014-12-13 ENCOUNTER — Telehealth: Payer: Self-pay | Admitting: *Deleted

## 2014-12-13 NOTE — Telephone Encounter (Signed)
Pt was seen in office on 12/12/14 for annual still bleeding from Plan B will and missed work today due to this. Pt asked letter could be typed for work to excuse her absent on 12/12/14 and today due to bleeding? Please advise

## 2014-12-13 NOTE — Telephone Encounter (Signed)
Ok, but can not due on a regular basis,  Thanks for typing letter for pt for absenses.

## 2014-12-14 ENCOUNTER — Encounter: Payer: Self-pay | Admitting: *Deleted

## 2014-12-14 NOTE — Telephone Encounter (Signed)
Left message for pt to call.

## 2014-12-14 NOTE — Telephone Encounter (Signed)
Pt aware will come pick up Letter, left up front for pick up

## 2014-12-19 ENCOUNTER — Other Ambulatory Visit: Payer: Self-pay | Admitting: Family Medicine

## 2014-12-23 ENCOUNTER — Telehealth: Payer: Self-pay

## 2014-12-23 NOTE — Telephone Encounter (Signed)
Pharm faxed req for RF of clonazepam. There is already a REFILL encounter forwarded to Dr Clelia CroftShaw about this.

## 2014-12-23 NOTE — Telephone Encounter (Signed)
No refill on controlled medication without OV - also concerned that pt was given an initial 30d supply which she has used in <3 wks - if she is needing higher doses, I would recommend that she discuss this with her psychiatrist.

## 2014-12-25 NOTE — Telephone Encounter (Signed)
Gave pt message.

## 2015-01-19 ENCOUNTER — Other Ambulatory Visit: Payer: Self-pay

## 2015-01-23 ENCOUNTER — Emergency Department (HOSPITAL_COMMUNITY)
Admission: EM | Admit: 2015-01-23 | Discharge: 2015-01-23 | Disposition: A | Discharge status: Home / Self Care | Payer: 59 | Attending: Emergency Medicine | Admitting: Emergency Medicine

## 2015-01-23 ENCOUNTER — Encounter (HOSPITAL_COMMUNITY): Payer: Self-pay | Admitting: Emergency Medicine

## 2015-01-23 DIAGNOSIS — Z79899 Other long term (current) drug therapy: Secondary | ICD-10-CM | POA: Insufficient documentation

## 2015-01-23 DIAGNOSIS — F319 Bipolar disorder, unspecified: Secondary | ICD-10-CM | POA: Diagnosis not present

## 2015-01-23 DIAGNOSIS — J45909 Unspecified asthma, uncomplicated: Secondary | ICD-10-CM | POA: Insufficient documentation

## 2015-01-23 DIAGNOSIS — Z3202 Encounter for pregnancy test, result negative: Secondary | ICD-10-CM | POA: Insufficient documentation

## 2015-01-23 DIAGNOSIS — Z793 Long term (current) use of hormonal contraceptives: Secondary | ICD-10-CM | POA: Diagnosis not present

## 2015-01-23 DIAGNOSIS — G9050 Complex regional pain syndrome I, unspecified: Secondary | ICD-10-CM | POA: Diagnosis not present

## 2015-01-23 DIAGNOSIS — Z8742 Personal history of other diseases of the female genital tract: Secondary | ICD-10-CM | POA: Diagnosis not present

## 2015-01-23 DIAGNOSIS — Z87891 Personal history of nicotine dependence: Secondary | ICD-10-CM | POA: Diagnosis not present

## 2015-01-23 DIAGNOSIS — Z862 Personal history of diseases of the blood and blood-forming organs and certain disorders involving the immune mechanism: Secondary | ICD-10-CM | POA: Insufficient documentation

## 2015-01-23 DIAGNOSIS — F410 Panic disorder [episodic paroxysmal anxiety] without agoraphobia: Secondary | ICD-10-CM | POA: Diagnosis present

## 2015-01-23 DIAGNOSIS — Z8669 Personal history of other diseases of the nervous system and sense organs: Secondary | ICD-10-CM | POA: Insufficient documentation

## 2015-01-23 DIAGNOSIS — F419 Anxiety disorder, unspecified: Secondary | ICD-10-CM | POA: Insufficient documentation

## 2015-01-23 LAB — URINALYSIS, ROUTINE W REFLEX MICROSCOPIC
BILIRUBIN URINE: NEGATIVE
Glucose, UA: NEGATIVE mg/dL
HGB URINE DIPSTICK: NEGATIVE
Ketones, ur: NEGATIVE mg/dL
Leukocytes, UA: NEGATIVE
Nitrite: NEGATIVE
PH: 6.5 (ref 5.0–8.0)
PROTEIN: NEGATIVE mg/dL
Specific Gravity, Urine: 1.013 (ref 1.005–1.030)
UROBILINOGEN UA: 0.2 mg/dL (ref 0.0–1.0)

## 2015-01-23 LAB — CBC WITH DIFFERENTIAL/PLATELET
Basophils Absolute: 0 10*3/uL (ref 0.0–0.1)
Basophils Relative: 1 % (ref 0–1)
EOS PCT: 1 % (ref 0–5)
Eosinophils Absolute: 0.1 10*3/uL (ref 0.0–0.7)
HCT: 36.3 % (ref 36.0–46.0)
HEMOGLOBIN: 11.7 g/dL — AB (ref 12.0–15.0)
LYMPHS ABS: 1.8 10*3/uL (ref 0.7–4.0)
LYMPHS PCT: 41 % (ref 12–46)
MCH: 27.9 pg (ref 26.0–34.0)
MCHC: 32.2 g/dL (ref 30.0–36.0)
MCV: 86.6 fL (ref 78.0–100.0)
Monocytes Absolute: 0.3 10*3/uL (ref 0.1–1.0)
Monocytes Relative: 6 % (ref 3–12)
NEUTROS ABS: 2.2 10*3/uL (ref 1.7–7.7)
Neutrophils Relative %: 51 % (ref 43–77)
Platelets: 262 10*3/uL (ref 150–400)
RBC: 4.19 MIL/uL (ref 3.87–5.11)
RDW: 13.6 % (ref 11.5–15.5)
WBC: 4.3 10*3/uL (ref 4.0–10.5)

## 2015-01-23 LAB — COMPREHENSIVE METABOLIC PANEL
ALBUMIN: 3.9 g/dL (ref 3.5–5.0)
ALK PHOS: 52 U/L (ref 38–126)
ALT: 12 U/L — ABNORMAL LOW (ref 14–54)
AST: 15 U/L (ref 15–41)
Anion gap: 8 (ref 5–15)
BUN: 14 mg/dL (ref 6–20)
CALCIUM: 8.9 mg/dL (ref 8.9–10.3)
CO2: 24 mmol/L (ref 22–32)
CREATININE: 0.8 mg/dL (ref 0.44–1.00)
Chloride: 107 mmol/L (ref 101–111)
GLUCOSE: 97 mg/dL (ref 65–99)
Potassium: 3.8 mmol/L (ref 3.5–5.1)
Sodium: 139 mmol/L (ref 135–145)
Total Bilirubin: 0.2 mg/dL — ABNORMAL LOW (ref 0.3–1.2)
Total Protein: 7.1 g/dL (ref 6.5–8.1)

## 2015-01-23 LAB — LIPASE, BLOOD: Lipase: 28 U/L (ref 22–51)

## 2015-01-23 LAB — POC URINE PREG, ED: PREG TEST UR: NEGATIVE

## 2015-01-23 MED ORDER — HYDROXYZINE HCL 25 MG PO TABS
50.0000 mg | ORAL_TABLET | Freq: Once | ORAL | Status: AC
  Administered 2015-01-23: 50 mg via ORAL
  Filled 2015-01-23: qty 2

## 2015-01-23 MED ORDER — HYDROXYZINE HCL 25 MG PO TABS: 25.0000 mg | ORAL_TABLET | Freq: Four times a day (QID) | ORAL | Status: DC | PRN

## 2015-01-23 MED ORDER — GI COCKTAIL ~~LOC~~
30.0000 mL | Freq: Once | ORAL | Status: AC
  Administered 2015-01-23: 30 mL via ORAL
  Filled 2015-01-23: qty 30

## 2015-01-23 MED ORDER — LORAZEPAM 1 MG PO TABS
1.0000 mg | ORAL_TABLET | Freq: Once | ORAL | Status: AC
  Administered 2015-01-23: 1 mg via ORAL
  Filled 2015-01-23: qty 1

## 2015-01-23 NOTE — ED Provider Notes (Signed)
CSN: [id number]     Arrival date & time 01/23/15  4098 History   First MD Initiated Contact with Patient 01/23/15 1037     Chief Complaint  Patient presents with  . Panic Attack     (Consider location/radiation/quality/duration/timing/severity/associated sxs/prior Treatment) HPI   Elizabeth Little is a(n) 24 y.o. female who presents to the emergency department with chief complaint of panic attack. She is followed by outpatient psychiatry. The patient states that she has been on Lexapro for the past 3 months, but feels that it is not working. She states that over the past 3 days. She's had "a constant panic attack." She states that she has racing thoughts. She is unable to sleep. She states that when her panic level is high, she feels like she has terrible stomach pain. She does not feel that she has anything else. She states that she feels she cannot eat anything at this time. Patient states that she was taking Klonopin. However, her psychiatrist will not refill the medication. She denies suicidal ideation, homicidal ideation or audiovisual hallucinations. She has never tried Vistaril for her panic. Denies fevers, chills, myalgias, arthralgias. Denies DOE, SOB, chest tightness or pressure, radiation to left arm, jaw or back, or diaphoresis. Denies dysuria, flank pain, suprapubic pain, frequency, urgency, or hematuria. Denies headaches, light headedness, weakness, visual disturbances.     Past Medical History  Diagnosis Date  . Asthma   . RSD (reflex sympathetic dystrophy)   . Ovarian cyst   . Bipolar 1 disorder   . Vertigo   . Anemia   . CRPS (complex regional pain syndrome)   . Anxiety   . Depression    Past Surgical History  Procedure Laterality Date  . Cholecystectomy     Family History  Problem Relation Age of Onset  . Mental illness Mother   . Mental illness Father   . Mental illness Brother   . Asthma Brother    History  Substance Use Topics  . Smoking status: Former Games developer   . Smokeless tobacco: Never Used  . Alcohol Use: 0.0 oz/week    0 Standard drinks or equivalent per week     Comment: Rarely, Less than monthly   OB History    Gravida Para Term Preterm AB TAB SAB Ectopic Multiple Living       Review of Systems  Ten systems reviewed and are negative for acute change, except as noted in the HPI.     Allergies  Mint chocolate chip flavor; Subutex; and Dilaudid  Home Medications   Prior to Admission medications   Medication Sig Start Date End Date Taking? Authorizing Provider  albuterol (PROVENTIL HFA;VENTOLIN HFA) 108 (90 BASE) MCG/ACT inhaler Inhale 2 puffs into the lungs every 6 (six) hours as needed for wheezing. 12/01/14  Yes Sherren Mocha, MD  bisacodyl (BISACODYL LAXATIVE) 10 MG suppository Place 1 suppository (10 mg total) rectally as needed for moderate constipation. 12/10/14  Yes Christopher Lawyer, PA-C  clonazePAM (KLONOPIN) 1 MG tablet Take 1 tablet (1 mg total) by mouth at bedtime. 12/01/14  Yes Sherren Mocha, MD  docusate sodium (COLACE) 100 MG capsule Take 1 capsule (100 mg total) by mouth 2 (two) times daily. Patient taking differently: Take 100 mg by mouth 2 (two) times daily as needed for mild constipation or moderate constipation.  12/10/14  Yes Christopher Lawyer, PA-C  escitalopram (LEXAPRO) 20 MG tablet Take 20 mg by mouth at bedtime.  Yes Historical Provider, MD  ibuprofen (ADVIL,MOTRIN) 600 MG tablet Take 1 tablet (600 mg total) by mouth every 8 (eight) hours as needed. 12/12/14  Yes Harrington ChallengerNancy J Young, NP  lactulose (CHRONULAC) 10 GM/15ML solution Take 15 mLs (10 g total) by mouth 2 (two) times daily. Patient taking differently: Take 10 g by mouth 2 (two) times daily as needed for severe constipation.  12/10/14  Yes Charlestine Nighthristopher Lawyer, PA-C  lamoTRIgine (LAMICTAL) 200 MG tablet Take 200 mg by mouth at bedtime.    Yes Historical Provider, MD  norgestimate-ethinyl estradiol (ORTHO-CYCLEN,SPRINTEC,PREVIFEM) 0.25-35 MG-MCG  tablet Take 1 tablet by mouth daily. 12/12/14  Yes Harrington ChallengerNancy J Young, NP  ondansetron (ZOFRAN ODT) 4 MG disintegrating tablet Take 1 tablet (4 mg total) by mouth every 8 (eight) hours as needed for nausea or vomiting. 12/01/14  Yes Sherren MochaEva N Shaw, MD  risperidone (RISPERDAL) 4 MG tablet Take 4 mg by mouth at bedtime.  11/29/14  Yes Historical Provider, MD  levonorgestrel (PLAN B) 0.75 MG tablet Take 2 tablets (1.5 mg total) by mouth once. Patient not taking: Reported on 01/23/2015 12/01/14   Sherren MochaEva N Shaw, MD   BP 125/75 mmHg  Pulse 102  Temp(Src) 97.6 F (36.4 C) (Oral)  Resp 18  SpO2 100%  LMP 01/09/2015 Physical Exam  Constitutional: She is oriented to person, place, and time. She appears well-developed and well-nourished. No distress.  HENT:  Head: Normocephalic and atraumatic.  Eyes: Conjunctivae are normal. No scleral icterus.  Neck: Normal range of motion.  Cardiovascular: Normal rate, regular rhythm and normal heart sounds.  Exam reveals no gallop and no friction rub.   No murmur heard. Pulmonary/Chest: Effort normal and breath sounds normal. No respiratory distress.  Abdominal: Soft. Bowel sounds are normal. She exhibits no distension and no mass. There is no tenderness. There is no guarding.  Neurological: She is alert and oriented to person, place, and time.  Skin: Skin is warm and dry. She is not diaphoretic.  Psychiatric: Thought content normal. Her mood appears anxious. Her speech is rapid and/or pressured (speech is child-like with a whiney pitch).    ED Course  Procedures (including critical care time) Labs Review Labs Reviewed  CBC WITH DIFFERENTIAL/PLATELET - Abnormal; Notable for the following:    Hemoglobin 11.7 (*)    All other components within normal limits  COMPREHENSIVE METABOLIC PANEL - Abnormal; Notable for the following:    ALT 12 (*)    Total Bilirubin 0.2 (*)    All other components within normal limits  URINALYSIS, ROUTINE W REFLEX MICROSCOPIC (NOT [facility]) -  Abnormal; Notable for the following:    APPearance CLOUDY (*)    All other components within normal limits  LIPASE, BLOOD  POC URINE PREG, ED    Imaging Review No results found.   EKG Interpretation None      MDM   Final diagnoses:  Anxiety    Patient with anxiety. I have given her vistaril and GI cocktail with some resolution of her panic any abdominal pain is resolved. Patient will receive 1 dose of Ativan. Discharged with Vistaril. Labs are reassuring. Follow up with her psychiatrist.    Arthor Captainbigail Wilton Thrall, PA-C 01/23/15 1621  Rolland PorterMark James, MD 01/31/15 [phone]

## 2015-01-23 NOTE — Discharge Instructions (Signed)

## 2015-01-23 NOTE — ED Notes (Signed)
Pt states that she has been having a panic attack for 3 days and it "won't go away and is just getting worse".  Denies any specific trigger.  States hx of panic attacks.  Pt states that she has also been having mid abd pain with vomiting that started "soon after" the panic attack started.

## 2015-02-11 ENCOUNTER — Inpatient Hospital Stay (HOSPITAL_COMMUNITY)
Admission: EM | Admit: 2015-02-11 | Discharge: 2015-02-13 | DRG: 918 | Disposition: A | Discharge status: Other Acute Inpatient Hospital | Payer: 59 | Attending: Internal Medicine | Admitting: Internal Medicine

## 2015-02-11 ENCOUNTER — Encounter (HOSPITAL_COMMUNITY): Payer: Self-pay | Admitting: Nurse Practitioner

## 2015-02-11 DIAGNOSIS — T426X2A Poisoning by other antiepileptic and sedative-hypnotic drugs, intentional self-harm, initial encounter: Principal | ICD-10-CM | POA: Diagnosis present

## 2015-02-11 DIAGNOSIS — Z9049 Acquired absence of other specified parts of digestive tract: Secondary | ICD-10-CM | POA: Diagnosis present

## 2015-02-11 DIAGNOSIS — F3164 Bipolar disorder, current episode mixed, severe, with psychotic features: Secondary | ICD-10-CM | POA: Diagnosis not present

## 2015-02-11 DIAGNOSIS — T6592XA Toxic effect of unspecified substance, intentional self-harm, initial encounter: Secondary | ICD-10-CM | POA: Diagnosis present

## 2015-02-11 DIAGNOSIS — T1491 Suicide attempt: Secondary | ICD-10-CM

## 2015-02-11 DIAGNOSIS — T50902A Poisoning by unspecified drugs, medicaments and biological substances, intentional self-harm, initial encounter: Secondary | ICD-10-CM | POA: Diagnosis not present

## 2015-02-11 DIAGNOSIS — Y929 Unspecified place or not applicable: Secondary | ICD-10-CM | POA: Diagnosis not present

## 2015-02-11 DIAGNOSIS — J45909 Unspecified asthma, uncomplicated: Secondary | ICD-10-CM | POA: Diagnosis present

## 2015-02-11 DIAGNOSIS — T450X2A Poisoning by antiallergic and antiemetic drugs, intentional self-harm, initial encounter: Secondary | ICD-10-CM | POA: Diagnosis present

## 2015-02-11 DIAGNOSIS — F1290 Cannabis use, unspecified, uncomplicated: Secondary | ICD-10-CM | POA: Diagnosis present

## 2015-02-11 DIAGNOSIS — Z915 Personal history of self-harm: Secondary | ICD-10-CM | POA: Diagnosis not present

## 2015-02-11 DIAGNOSIS — X58XXXA Exposure to other specified factors, initial encounter: Secondary | ICD-10-CM | POA: Diagnosis not present

## 2015-02-11 DIAGNOSIS — Z87891 Personal history of nicotine dependence: Secondary | ICD-10-CM

## 2015-02-11 DIAGNOSIS — R000 Tachycardia, unspecified: Secondary | ICD-10-CM

## 2015-02-11 DIAGNOSIS — T50901A Poisoning by unspecified drugs, medicaments and biological substances, accidental (unintentional), initial encounter: Secondary | ICD-10-CM | POA: Diagnosis not present

## 2015-02-11 DIAGNOSIS — F319 Bipolar disorder, unspecified: Secondary | ICD-10-CM | POA: Diagnosis not present

## 2015-02-11 DIAGNOSIS — T1491XA Suicide attempt, initial encounter: Secondary | ICD-10-CM

## 2015-02-11 DIAGNOSIS — T39312A Poisoning by propionic acid derivatives, intentional self-harm, initial encounter: Secondary | ICD-10-CM | POA: Diagnosis present

## 2015-02-11 LAB — RAPID URINE DRUG SCREEN, HOSP PERFORMED
Amphetamines: NOT DETECTED
Barbiturates: NOT DETECTED
Benzodiazepines: NOT DETECTED
Cocaine: NOT DETECTED
Opiates: NOT DETECTED
Tetrahydrocannabinol: POSITIVE — AB

## 2015-02-11 LAB — MRSA PCR SCREENING: MRSA BY PCR: NEGATIVE

## 2015-02-11 LAB — COMPREHENSIVE METABOLIC PANEL
ALT: 12 U/L — ABNORMAL LOW (ref 14–54)
AST: 19 U/L (ref 15–41)
Albumin: 4.7 g/dL (ref 3.5–5.0)
Alkaline Phosphatase: 65 U/L (ref 38–126)
Anion gap: 13 (ref 5–15)
BUN: 12 mg/dL (ref 6–20)
CO2: 20 mmol/L — ABNORMAL LOW (ref 22–32)
Calcium: 9.3 mg/dL (ref 8.9–10.3)
Chloride: 108 mmol/L (ref 101–111)
Creatinine, Ser: 0.96 mg/dL (ref 0.44–1.00)
GFR calc Af Amer: >60 mL/min (ref >60)
GFR calc non Af Amer: >60 mL/min (ref >60)
Glucose, Bld: 109 mg/dL — ABNORMAL HIGH (ref 65–99)
Potassium: 3.7 mmol/L (ref 3.5–5.1)
Sodium: 141 mmol/L (ref 135–145)
Total Bilirubin: 0.4 mg/dL (ref 0.3–1.2)
Total Protein: 8.1 g/dL (ref 6.5–8.1)

## 2015-02-11 LAB — CBC
HCT: 41.7 % (ref 36.0–46.0)
Hemoglobin: 13.6 g/dL (ref 12.0–15.0)
MCH: 28.8 pg (ref 26.0–34.0)
MCHC: 32.6 g/dL (ref 30.0–36.0)
MCV: 88.3 fL (ref 78.0–100.0)
Platelets: 264 10*3/uL (ref 150–400)
RBC: 4.72 MIL/uL (ref 3.87–5.11)
RDW: 13.3 % (ref 11.5–15.5)
WBC: 5.7 10*3/uL (ref 4.0–10.5)

## 2015-02-11 LAB — LACTIC ACID, PLASMA
Lactic Acid, Venous: 2.7 mmol/L (ref 0.5–2.0)
Lactic Acid, Venous: 0.8 mmol/L (ref 0.5–2.0)

## 2015-02-11 LAB — ETHANOL: Alcohol, Ethyl (B): <5 mg/dL (ref <5)

## 2015-02-11 LAB — ACETAMINOPHEN LEVEL: Acetaminophen (Tylenol), Serum: <10 ug/mL — ABNORMAL LOW (ref 10–30)

## 2015-02-11 LAB — POC URINE PREG, ED: Preg Test, Ur: NEGATIVE

## 2015-02-11 LAB — SALICYLATE LEVEL: Salicylate Lvl: <4 mg/dL (ref 2.8–30.0)

## 2015-02-11 MED ORDER — ALBUTEROL SULFATE HFA 108 (90 BASE) MCG/ACT IN AERS: 2.0000 | INHALATION_SPRAY | Freq: Four times a day (QID) | RESPIRATORY_TRACT | Status: DC | PRN

## 2015-02-11 MED ORDER — ENOXAPARIN SODIUM 40 MG/0.4ML ~~LOC~~ SOLN
40.0000 mg | SUBCUTANEOUS | Status: DC
  Administered 2015-02-11 – 2015-02-12 (×2): 40 mg via SUBCUTANEOUS
  Filled 2015-02-11 (×3): qty 0.4

## 2015-02-11 MED ORDER — SODIUM CHLORIDE 0.9 % IV SOLN
INTRAVENOUS | Status: AC
  Administered 2015-02-11 – 2015-02-12 (×3): via INTRAVENOUS

## 2015-02-11 MED ORDER — SODIUM CHLORIDE 0.9 % IV SOLN: INTRAVENOUS | Status: DC

## 2015-02-11 MED ORDER — SODIUM CHLORIDE 0.9 % IV BOLUS (SEPSIS)
1000.0000 mL | Freq: Once | INTRAVENOUS | Status: AC
  Administered 2015-02-11: 1000 mL via INTRAVENOUS

## 2015-02-11 MED ORDER — ALBUTEROL SULFATE (2.5 MG/3ML) 0.083% IN NEBU: 2.5000 mg | INHALATION_SOLUTION | Freq: Four times a day (QID) | RESPIRATORY_TRACT | Status: DC | PRN

## 2015-02-11 MED ORDER — ONDANSETRON HCL 4 MG/2ML IJ SOLN
4.0000 mg | Freq: Four times a day (QID) | INTRAMUSCULAR | Status: DC | PRN
  Administered 2015-02-11 – 2015-02-13 (×4): 4 mg via INTRAVENOUS
  Filled 2015-02-11 (×4): qty 2

## 2015-02-11 MED ORDER — SODIUM CHLORIDE 0.9 % IJ SOLN
3.0000 mL | Freq: Two times a day (BID) | INTRAMUSCULAR | Status: DC
  Administered 2015-02-11 – 2015-02-12 (×2): 3 mL via INTRAVENOUS

## 2015-02-11 MED ORDER — ONDANSETRON HCL 4 MG/2ML IJ SOLN
4.0000 mg | Freq: Once | INTRAMUSCULAR | Status: AC
  Administered 2015-02-11: 4 mg via INTRAVENOUS
  Filled 2015-02-11: qty 2

## 2015-02-11 NOTE — ED Notes (Signed)
Pt father called and sts that he is out of town at this time and will be here to see pt in approx 3 hrs

## 2015-02-11 NOTE — ED Notes (Signed)
Poison control recommends symptomatic and supportive care and obs for 4-6 hours for changes, watching for QRS, urinating normal, cardiac monitoring per [place].

## 2015-02-11 NOTE — ED Notes (Signed)
Left pts father a vmail that pt is in ED at her request.

## 2015-02-11 NOTE — ED Notes (Signed)
Pt HR up to 140's. Advised Dr Juleen China and provided another EKG. Dr Juleen China at bedside to assess pt.

## 2015-02-11 NOTE — ED Notes (Signed)
Admitting blood orders cancelled d/t they already been drawn

## 2015-02-11 NOTE — H&P (Signed)
[facility] History and Physical  Elizabeth Little AVW:[id number] DOB: 17-Apr-1991 DOA: 02/11/2015  Referring physician: Emergency Department PCP: No PCP Per Patient  Specialists:   Chief Complaint: Intentional drug overdose  HPI: Elizabeth Little is a 24 y.o. female  With a hx of bipolar d/o, asthma, depression, who presents to the ED after intentionally taking "a handful" of ambien, benadryl, and ibuprofen mid-morning. In the ED, pt found to have serum bicarb of 20, other bloodwork unremarkable. Poison control contacted through ED, recommending close observation and hydration. Hospitalist consulted.  Review of Systems: Unable to obtain given confusion currently  Past Medical History  Diagnosis Date  . Asthma   . RSD (reflex sympathetic dystrophy)   . Ovarian cyst   . Bipolar 1 disorder   . Vertigo   . Anemia   . CRPS (complex regional pain syndrome)   . Anxiety   . Depression    Past Surgical History  Procedure Laterality Date  . Cholecystectomy     Social History:  reports that she has quit smoking. She has never used smokeless tobacco. She reports that she drinks alcohol. She reports that she uses illicit drugs (Marijuana).  where does patient live--home, ALF, SNF? and with whom if at home?  Can patient participate in ADLs?  Allergies  Allergen Reactions  . Mint Chocolate Chip Flavor [Flavoring Agent] Swelling and Rash    Pt reports "I am allergic to mint. I get a rash and swollen lips. I need an epi-pen if I eat mint."  . Subutex [Buprenorphine] Shortness Of Breath    "Was unable to breath on my own"  . Dilaudid [Hydromorphone Hcl] Itching    Itches until she bleeds    Family History  Problem Relation Age of Onset  . Mental illness Mother   . Mental illness Father   . Mental illness Brother   . Asthma Brother     (be sure to complete)  Prior to Admission medications   Medication Sig Start Date End Date Taking? Authorizing Provider  albuterol (PROVENTIL  HFA;VENTOLIN HFA) 108 (90 BASE) MCG/ACT inhaler Inhale 2 puffs into the lungs every 6 (six) hours as needed for wheezing. 12/01/14  Yes Sherren Mocha, MD  bisacodyl (BISACODYL LAXATIVE) 10 MG suppository Place 1 suppository (10 mg total) rectally as needed for moderate constipation. 12/10/14  Yes Christopher Lawyer, PA-C  docusate sodium (COLACE) 100 MG capsule Take 1 capsule (100 mg total) by mouth 2 (two) times daily. Patient taking differently: Take 100 mg by mouth 2 (two) times daily as needed for mild constipation or moderate constipation.  12/10/14  Yes Christopher Lawyer, PA-C  escitalopram (LEXAPRO) 20 MG tablet Take 20 mg by mouth at bedtime.    Yes Historical Provider, MD  hydrOXYzine (ATARAX/VISTARIL) 25 MG tablet Take 1-2 tablets (25-50 mg total) by mouth every 6 (six) hours as needed for anxiety. 01/23/15  Yes Arthor Captain, PA-C  ibuprofen (ADVIL,MOTRIN) 600 MG tablet Take 1 tablet (600 mg total) by mouth every 8 (eight) hours as needed. Patient taking differently: Take 600 mg by mouth every 8 (eight) hours as needed for moderate pain.  12/12/14  Yes Harrington Challenger, NP  lactulose (CHRONULAC) 10 GM/15ML solution Take 15 mLs (10 g total) by mouth 2 (two) times daily. Patient taking differently: Take 10 g by mouth 2 (two) times daily as needed for severe constipation.  12/10/14  Yes Charlestine Night, PA-C  lamoTRIgine (LAMICTAL) 200 MG tablet Take 200 mg by mouth at bedtime.  Yes Historical Provider, MD  norgestimate-ethinyl estradiol (ORTHO-CYCLEN,SPRINTEC,PREVIFEM) 0.25-35 MG-MCG tablet Take 1 tablet by mouth daily. 12/12/14  Yes Harrington Challenger, NP  risperidone (RISPERDAL) 4 MG tablet Take 4 mg by mouth at bedtime.  11/29/14  Yes Historical Provider, MD  clonazePAM (KLONOPIN) 1 MG tablet Take 1 tablet (1 mg total) by mouth at bedtime. Patient not taking: Reported on 02/11/2015 12/01/14   Sherren Mocha, MD  levonorgestrel (PLAN B) 0.75 MG tablet Take 2 tablets (1.5 mg total) by mouth once. Patient not  taking: Reported on 01/23/2015 12/01/14   Sherren Mocha, MD  ondansetron (ZOFRAN ODT) 4 MG disintegrating tablet Take 1 tablet (4 mg total) by mouth every 8 (eight) hours as needed for nausea or vomiting. Patient not taking: Reported on 02/11/2015 12/01/14   Sherren Mocha, MD   Physical Exam: Filed Vitals:   02/11/15 1330 02/11/15 1345 02/11/15 1400 02/11/15 1415  BP:      Pulse:      Temp:      TempSrc:      Resp: 30 22 29 29   SpO2:         General:  Awake, confused, in nad  Eyes: PERRL B  ENT: membranes moist, dentition fair  Neck: trachea midline, neck supple  Cardiovascular: regular, s1, s2  Respiratory: normal resp effort, no wheezing  Abdomen: soft,nondistended  Skin: normal skin turgor no abnormal skin lesions seen  Musculoskeletal: perfused, no clubbing  Psychiatric: confused, tearful, depressed  Neurologic: cn2-12 grossly intact, strength/sensation intact  Labs on Admission:  Basic Metabolic Panel:  Recent Labs Lab 02/11/15 1049  NA 141  K 3.7  CL 108  CO2 20*  GLUCOSE 109*  BUN 12  CREATININE 0.96  CALCIUM 9.3   Liver Function Tests:  Recent Labs Lab 02/11/15 1049  AST 19  ALT 12*  ALKPHOS 65  BILITOT 0.4  PROT 8.1  ALBUMIN 4.7   No results for input(s): LIPASE, AMYLASE in the last 168 hours. No results for input(s): AMMONIA in the last 168 hours. CBC:  Recent Labs Lab 02/11/15 1049  WBC 5.7  HGB 13.6  HCT 41.7  MCV 88.3  PLT 264   Cardiac Enzymes: No results for input(s): CKTOTAL, CKMB, CKMBINDEX, TROPONINI in the last 168 hours.  BNP (last 3 results) No results for input(s): BNP in the last 8760 hours.  ProBNP (last 3 results) No results for input(s): PROBNP in the last 8760 hours.  CBG: No results for input(s): GLUCAP in the last 168 hours.  Radiological Exams on Admission: No results found.   Assessment/Plan Principal Problem:   Suicide attempt Active Problems:   Bipolar disorder, current episode mixed, severe, with  psychotic features   Intentional drug overdose   Drug overdose  1. Suicide attempt with intentional drug overdose 1. Admit to stepdown 2. Cont tele 3. Cont hydration as tolerated 4. Poison control was notified through ed 5. Will consult psychiatry for assistance 6. Suicide precautions 2. Bipolar d/o 1. Per above, Psychiatry consult 3. Depression 1. Per above 4. DVT prophylaxis 1. lovenox subq   Code Status: Full  Family Communication: Pt in room Disposition Plan: Admit to stepdown    Merek Niu, Scheryl Marten [facility] Pager [phone]  If 7PM-7AM, please contact night-coverage [email or web address] Password [email or web address] 02/11/2015, 2:52 PM

## 2015-02-11 NOTE — ED Notes (Signed)
Pt vomited several times but is too sleepy ti clear. Sitter and tech helped pt clear mouth. Pt VSS and pt in NAD. Dr Juleen China notified

## 2015-02-11 NOTE — ED Provider Notes (Signed)
CSN: [id number]     Arrival date & time 02/11/15  1029 History   First MD Initiated Contact with Patient 02/11/15 1048     Chief Complaint  Patient presents with  . Suicidal  . Ingestion     (Consider location/radiation/quality/duration/timing/severity/associated sxs/prior Treatment) HPI   24 year old female presenting after an intentional overdose.Happened shortly before arrival. Patient is currently altered and unable to give reliable history. Took unknown quantity of Ambien, diphenhydramine and ibuprofen. Apparently hearing voices telling her to take this. Currently patient is rambling incoherently. I cannot follow her thought process. Keeps mentioning her boyfriend.  Past Medical History  Diagnosis Date  . Asthma   . RSD (reflex sympathetic dystrophy)   . Ovarian cyst   . Bipolar 1 disorder   . Vertigo   . Anemia   . CRPS (complex regional pain syndrome)   . Anxiety   . Depression    Past Surgical History  Procedure Laterality Date  . Cholecystectomy     Family History  Problem Relation Age of Onset  . Mental illness Mother   . Mental illness Father   . Mental illness Brother   . Asthma Brother    History  Substance Use Topics  . Smoking status: Former Games developer  . Smokeless tobacco: Never Used  . Alcohol Use: 0.0 oz/week    0 Standard drinks or equivalent per week     Comment: Rarely, Less than monthly   OB History    Gravida Para Term Preterm AB TAB SAB Ectopic Multiple Living       Review of Systems  All systems reviewed and negative, other than as noted in HPI.   Allergies  Mint chocolate chip flavor; Subutex; and Dilaudid  Home Medications   Prior to Admission medications   Medication Sig Start Date End Date Taking? Authorizing Provider  albuterol (PROVENTIL HFA;VENTOLIN HFA) 108 (90 BASE) MCG/ACT inhaler Inhale 2 puffs into the lungs every 6 (six) hours as needed for wheezing. 12/01/14   Sherren Mocha, MD  bisacodyl (BISACODYL  LAXATIVE) 10 MG suppository Place 1 suppository (10 mg total) rectally as needed for moderate constipation. 12/10/14   Charlestine Night, PA-C  clonazePAM (KLONOPIN) 1 MG tablet Take 1 tablet (1 mg total) by mouth at bedtime. 12/01/14   Sherren Mocha, MD  docusate sodium (COLACE) 100 MG capsule Take 1 capsule (100 mg total) by mouth 2 (two) times daily. Patient taking differently: Take 100 mg by mouth 2 (two) times daily as needed for mild constipation or moderate constipation.  12/10/14   Charlestine Night, PA-C  escitalopram (LEXAPRO) 20 MG tablet Take 20 mg by mouth at bedtime.     Historical Provider, MD  hydrOXYzine (ATARAX/VISTARIL) 25 MG tablet Take 1-2 tablets (25-50 mg total) by mouth every 6 (six) hours as needed for anxiety. 01/23/15   Arthor Captain, PA-C  ibuprofen (ADVIL,MOTRIN) 600 MG tablet Take 1 tablet (600 mg total) by mouth every 8 (eight) hours as needed. 12/12/14   Harrington Challenger, NP  lactulose (CHRONULAC) 10 GM/15ML solution Take 15 mLs (10 g total) by mouth 2 (two) times daily. Patient taking differently: Take 10 g by mouth 2 (two) times daily as needed for severe constipation.  12/10/14   Charlestine Night, PA-C  lamoTRIgine (LAMICTAL) 200 MG tablet Take 200 mg by mouth at bedtime.     Historical Provider, MD  levonorgestrel (PLAN B) 0.75 MG tablet Take 2 tablets (1.5 mg  total) by mouth once. Patient not taking: Reported on 01/23/2015 12/01/14   Sherren Mocha, MD  norgestimate-ethinyl estradiol (ORTHO-CYCLEN,SPRINTEC,PREVIFEM) 0.25-35 MG-MCG tablet Take 1 tablet by mouth daily. 12/12/14   Harrington Challenger, NP  ondansetron (ZOFRAN ODT) 4 MG disintegrating tablet Take 1 tablet (4 mg total) by mouth every 8 (eight) hours as needed for nausea or vomiting. 12/01/14   Sherren Mocha, MD  risperidone (RISPERDAL) 4 MG tablet Take 4 mg by mouth at bedtime.  11/29/14   Historical Provider, MD   BP 134/89 mmHg  Pulse 143  Temp(Src) 97.5 F (36.4 C) (Oral)  Resp 20  SpO2 100%  LMP 01/09/2015 Physical  Exam  Constitutional: She appears well-developed and well-nourished. No distress.  HENT:  Head: Normocephalic and atraumatic.  Eyes: Conjunctivae are normal. Right eye exhibits no discharge. Left eye exhibits no discharge.  Neck: Neck supple.  Cardiovascular: Normal rate, regular rhythm and normal heart sounds.  Exam reveals no gallop and no friction rub.   No murmur heard. Pulmonary/Chest: Effort normal and breath sounds normal. No respiratory distress.  Abdominal: Soft. She exhibits no distension. There is no tenderness.  Musculoskeletal: She exhibits no edema or tenderness.  Neurological: She is alert.  Flows simple commands. Horizontal nystagmus. Cranial nerves appear to be intact. Strength is 5 out of 5 bilateral upper lower extremities. Tone seems normal. Normal patellar reflexes. Could not follow along with finger-to-nose testing.   Skin: Skin is warm and dry.  Psychiatric: She has a normal mood and affect. Her behavior is normal. Thought content normal.  Nursing note and vitals reviewed.   ED Course  Procedures (including critical care time) Labs Review Labs Reviewed  ACETAMINOPHEN LEVEL - Abnormal; Notable for the following:    Acetaminophen (Tylenol), Serum <10 (*)    All other components within normal limits  COMPREHENSIVE METABOLIC PANEL - Abnormal; Notable for the following:    CO2 20 (*)    Glucose, Bld 109 (*)    ALT 12 (*)    All other components within normal limits  URINE RAPID DRUG SCREEN, HOSP PERFORMED - Abnormal; Notable for the following:    Tetrahydrocannabinol POSITIVE (*)    All other components within normal limits  LACTIC ACID, PLASMA - Abnormal; Notable for the following:    Lactic Acid, Venous 2.7 (*)    All other components within normal limits  COMPREHENSIVE METABOLIC PANEL - Abnormal; Notable for the following:    CO2 21 (*)    Calcium 8.6 (*)    Total Protein 6.2 (*)    AST 13 (*)    ALT 10 (*)    All other components within normal limits   CBC - Abnormal; Notable for the following:    RBC 3.74 (*)    Hemoglobin 10.6 (*)    HCT 33.0 (*)    All other components within normal limits  ACETAMINOPHEN LEVEL - Abnormal; Notable for the following:    Acetaminophen (Tylenol), Serum <10 (*)    All other components within normal limits  MRSA PCR SCREENING  CBC  ETHANOL  SALICYLATE LEVEL  LACTIC ACID, PLASMA  POC URINE PREG, ED    Imaging Review No results found.   EKG Interpretation   Date/Time:  Sunday February 11 2015 12:59:25 EDT Ventricular Rate:  136 PR Interval:  137 QRS Duration: 81 QT Interval:  278 QTC Calculation: 418 R Axis:   106 Text Interpretation:  Sinus tachycardia Probable left atrial enlargement  Borderline right axis deviation Borderline  repolarization abnormality ED  PHYSICIAN INTERPRETATION AVAILABLE IN [facility] Confirmed by TEST,  Record ([id number]) on 02/12/2015 7:07:38 AM      MDM   Final diagnoses:  Overdose, intentional self-harm, initial encounter  Sinus tachycardia    24 year old female with apparent intentional drug overdose. Persistently tachycardic. Likely vagolytic effect of diphenhydramine. No QRS widening on initial or repeat EKGs. Pt remains signficant altered and tachycardic after several hours in ED. Will not be able to medically clear in reasonable time frame in ED. Needs monitoring and supportive care as needed.     Raeford Razor, MD 02/13/15 [phone]

## 2015-02-11 NOTE — ED Notes (Signed)
Pt reports hx of bipolar, compliant with medications. Reports SI thoughts x1 weeks, today at 1015 pt took a "handfull" of ambien, benadryl, and ibuprofen in an attempt to hurt herself. Past SI atttempt by OD in Dec 2015, and past SI attempt by cutting when 24 years old. At present pt denies cutting. Pt reports she hears voices that "tell her she should just end it and kill herself". Denies hx of schizophrenia. Denies HI, VH. Pt denies pain. Denies nausea. Pt can contract for safety.   Pt requesting nurse call pts father Brett [place] and alert him pt is in ED. Brett [place] [phone]

## 2015-02-12 DIAGNOSIS — T50902A Poisoning by unspecified drugs, medicaments and biological substances, intentional self-harm, initial encounter: Secondary | ICD-10-CM

## 2015-02-12 DIAGNOSIS — F319 Bipolar disorder, unspecified: Secondary | ICD-10-CM

## 2015-02-12 LAB — COMPREHENSIVE METABOLIC PANEL
ALBUMIN: 3.6 g/dL (ref 3.5–5.0)
ALT: 10 U/L — ABNORMAL LOW (ref 14–54)
ANION GAP: 8 (ref 5–15)
AST: 13 U/L — ABNORMAL LOW (ref 15–41)
Alkaline Phosphatase: 49 U/L (ref 38–126)
BUN: 8 mg/dL (ref 6–20)
CO2: 21 mmol/L — ABNORMAL LOW (ref 22–32)
CREATININE: 0.79 mg/dL (ref 0.44–1.00)
Calcium: 8.6 mg/dL — ABNORMAL LOW (ref 8.9–10.3)
Chloride: 109 mmol/L (ref 101–111)
GFR calc Af Amer: >60 mL/min (ref >60)
GFR calc non Af Amer: >60 mL/min (ref >60)
Glucose, Bld: 95 mg/dL (ref 65–99)
Potassium: 4.1 mmol/L (ref 3.5–5.1)
Sodium: 138 mmol/L (ref 135–145)
TOTAL PROTEIN: 6.2 g/dL — AB (ref 6.5–8.1)
Total Bilirubin: 0.7 mg/dL (ref 0.3–1.2)

## 2015-02-12 LAB — CBC
HCT: 33 % — ABNORMAL LOW (ref 36.0–46.0)
HEMOGLOBIN: 10.6 g/dL — AB (ref 12.0–15.0)
MCH: 28.3 pg (ref 26.0–34.0)
MCHC: 32.1 g/dL (ref 30.0–36.0)
MCV: 88.2 fL (ref 78.0–100.0)
PLATELETS: 234 10*3/uL (ref 150–400)
RBC: 3.74 MIL/uL — ABNORMAL LOW (ref 3.87–5.11)
RDW: 13.3 % (ref 11.5–15.5)
WBC: 8.4 10*3/uL (ref 4.0–10.5)

## 2015-02-12 MED ORDER — POLYETHYLENE GLYCOL 3350 17 G PO PACK
17.0000 g | PACK | Freq: Every day | ORAL | Status: DC
  Administered 2015-02-12 – 2015-02-13 (×2): 17 g via ORAL
  Filled 2015-02-12 (×2): qty 1

## 2015-02-12 NOTE — Progress Notes (Signed)
[facility] PROGRESS NOTE  Elizabeth Little ASN:[id number] DOB: [birth year] DOA: 02/11/2015 PCP: No PCP Per Patient  Assessment/Plan: 1. Suicide attempt with intentional drug overdose 1. Patient was admitted to stepdown for closer monitoring 2. CPt is continued on aggressive hydration 3. Poison control was notified through ed 4. Psychiatry has been consulted for assistance 5. Continue suicide precautions 6. Tearful this AM and states, "I don't want to talk about it." 2. Delirium 1. Noted on admission, secondary to drug overdose 2. Much improved 3. Bipolar d/o 1. Per above, Psychiatry consult 4. Depression 1. Per above 5. DVT prophylaxis 1. lovenox subq while inpatient  Code Status: Full Family Communication: Pt in room, father at bedside Disposition Plan: Pending   Consultants:  Psychiatry  Procedures:    Antibiotics:  none (indicate start date, and stop date if known)  HPI/Subjective: Tearful, states feeling better  Objective: Filed Vitals:   02/12/15 0800 02/12/15 1000 02/12/15 1100 02/12/15 1200  BP: 137/60 137/60  127/73  Pulse: 65   58  Temp:   98.9 F (37.2 C)   TempSrc:   Oral   Resp: 11 24 17 18   Height:      Weight:      SpO2: 100% 99% 100% 99%    Intake/Output Summary (Last 24 hours) at 02/12/15 1414 Last data filed at 02/12/15 1300  Gross per 24 hour  Intake 3145.83 ml  Output   4100 ml  Net -954.17 ml   Filed Weights   02/11/15 1600  Weight: 71 kg (156 lb 8.4 oz)    Exam:   General:  Awake, in nad  Cardiovascular: regular,s1, s2  Respiratory: normal resp effort, no wheezing  Abdomen: soft,nondistended  Musculoskeletal: perfused, no clubbing   Data Reviewed: Basic Metabolic Panel:  Recent Labs Lab 02/11/15 1049 02/12/15 0345  NA 141 138  Little 3.7 4.1  CL 108 109  CO2 20* 21*  GLUCOSE 109* 95  BUN 12 8  CREATININE 0.96 0.79  CALCIUM 9.3 8.6*   Liver Function Tests:  Recent Labs Lab 02/11/15 1049  02/12/15 0345  AST 19 13*  ALT 12* 10*  ALKPHOS 65 49  BILITOT 0.4 0.7  PROT 8.1 6.2*  ALBUMIN 4.7 3.6   No results for input(s): LIPASE, AMYLASE in the last 168 hours. No results for input(s): AMMONIA in the last 168 hours. CBC:  Recent Labs Lab 02/11/15 1049 02/12/15 0345  WBC 5.7 8.4  HGB 13.6 10.6*  HCT 41.7 33.0*  MCV 88.3 88.2  PLT 264 234   Cardiac Enzymes: No results for input(s): CKTOTAL, CKMB, CKMBINDEX, TROPONINI in the last 168 hours. BNP (last 3 results) No results for input(s): BNP in the last 8760 hours.  ProBNP (last 3 results) No results for input(s): PROBNP in the last 8760 hours.  CBG: No results for input(s): GLUCAP in the last 168 hours.  Recent Results (from the past 240 hour(s))  MRSA PCR Screening     Status: None   Collection Time: 02/11/15  4:00 PM  Result Value Ref Range Status   MRSA by PCR NEGATIVE NEGATIVE Final    Comment:        The GeneXpert MRSA Assay (FDA approved for NASAL specimens only), is one component of a comprehensive MRSA colonization surveillance program. It is not intended to diagnose MRSA infection nor to guide or monitor treatment for MRSA infections.      Studies: No results found.  Scheduled Meds: . enoxaparin (LOVENOX) injection  40 mg Subcutaneous Q24H  .  sodium chloride  3 mL Intravenous Q12H   Continuous Infusions: . sodium chloride 125 mL/hr at 02/12/15 1341    Principal Problem:   Suicide attempt Active Problems:   Bipolar disorder, current episode mixed, severe, with psychotic features   Intentional drug overdose   Drug overdose   Elizabeth Little  [facility] Pager [phone]. If 7PM-7AM, please contact night-coverage at [email or web address], password [facility] 02/12/2015, 2:14 PM  LOS: 1 day
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
## 2015-02-12 NOTE — Care Management Note (Signed)
Case Management Note  Patient Details  Name: Elizabeth Little MRN: [id number] Date of Birth: 12/05/1990  Subjective/Objective:              overdose      Action/Plan: tbd   Expected Discharge Date:         [id number]         Expected Discharge Plan:  Home/Self Care  In-House Referral:  Clinical Social Work  Discharge planning Services  CM Consult  Post Acute Care Choice:  NA Choice offered to:  NA  DME Arranged:    DME Agency:     HH Arranged:    HH Agency:     Status of Service:  In process, will continue to follow  Medicare Important Message Given:    Date Medicare IM Given:    Medicare IM give by:    Date Additional Medicare IM Given:    Additional Medicare Important Message give by:     If discussed at Long Length of Stay Meetings, dates discussed:    Additional Comments:  Golda AcreDavis, Rhonda Lynn, RN 02/12/2015, 12:18 PM

## 2015-02-12 NOTE — Plan of Care (Signed)
Problem: Phase I Progression Outcomes Goal: OOB as tolerated unless otherwise ordered Outcome: Completed/Met Date Met:  02/12/15 Suicide sitter at bedside.

## 2015-02-12 NOTE — Progress Notes (Signed)
Date:  February 12, 2015 U.R. performed for needs and level of care. Will continue to follow for Case Management needs.  Taiana Temkin, RN, BSN, CCM   [phone] 

## 2015-02-12 NOTE — Clinical Social Work Psych Assess (Signed)
Clinical Social Work Nature conservation officer  Clinical Social Worker:  Boone Master, [place] Date/Time:  02/12/2015, 10:17 AM Referred By:  Physician Date Referred:  02/12/15 Reason for Referral:  Behavioral Health Issues   Presenting Symptoms/Problems  Presenting Symptoms/Problems(in person's/family's own words):  Psych consulted due to overdose.   Abuse/Neglect/Trauma History  Abuse/Neglect/Trauma History:  Denies History Abuse/Neglect/Trauma History Comments (indicate dates):  N/A   Psychiatric History  Psychiatric History:  Inpatient/Hospitalization, Outpatient Treatment Psychiatric Medication:  Patient reports she was prescribed Klonopin, Lexapro, and Risperdal.    Current Mental Health Hospitalizations/Previous Mental Health History:  Patient was diagnosed with bipolar disorder when she was 24 years old.   Current Provider:  Dr. Candis Schatz Place and Date:  [facility]   [place], [place])  Current Medications:  Scheduled Meds: . enoxaparin (LOVENOX) injection  40 mg Subcutaneous Q24H  . sodium chloride  3 mL Intravenous Q12H   Continuous Infusions: . sodium chloride 125 mL/hr at 02/11/15 1502   PRN Meds:.albuterol, ondansetron (ZOFRAN) IV     Previous Inpatient Admission/Date/Reason:  Patient reports hospitalizations in [place], [place], and [place].   Emotional Health/Current Symptoms  Suicide/Self Harm: Self-Injurious Behaviors (ex. picking & pinching or carving on skin, chronic runaway, poor judgement), Suicidal Ideation (ex. "I can't take anymore, I wish I could disappear"), Suicide Attempt in the Past (date/description) Suicide Attempt in Past (date/description):  Patient attempted to slit her throat when she was 24 years old. Patient reports 8-9 attempts total with the most recent attempt with overdosing.  Other Harmful Behavior (ex. homicidal ideation) (describe):  Patient has a history  of cutting.   Psychotic/Dissociative  Symptoms  Psychotic/Dissociative Symptoms: Auditory Hallucinations Other Psychotic/Dissociative Symptoms:  Patient hears voices that tell her she is not good enough and should harm herself. Patient denies any VH.   Attention/Behavioral Symptoms  Attention/Behavioral Symptoms: Within Normal Limits Other Attention/Behavioral Symptoms:  Patient engaged during assessment.   Cognitive Impairment  Cognitive Impairment:  Within Normal Limits Other Cognitive Impairment:  Patient alert and oriented.   Mood and Adjustment  Mood and Adjustment:  Depression   Stress, Anxiety, Trauma, Any Recent Loss/Stressor  Stress, Anxiety, Trauma, Any Recent Loss/Stressor: Compulsive Behavior, Relationship Anxiety (frequency):  Patient reports anxiety increases during manic episodes. Patient does not feel anxiety is being managed efficiently by medication.  Phobia (specify):  N/A  Compulsive Behavior (specify):  Patient reports she spends money compulsively when she is feeling manic.  Obsessive Behavior (specify):  N/A  Other Stress, Anxiety, Trauma, Any Recent Loss/Stressor:  Patient has strained relationship with mother.   Substance Abuse/Use  Substance Abuse/Use: None SBIRT Completed (please refer for detailed history): No Self-reported Substance Use (last use and frequency):  Patient declines to complete SBIRT and denies any substance use but had positive screening for THC.  Urinary Drug Screen Completed: No, Yes Alcohol Level:  <5   Environment/Housing/Living Arrangement  Environmental/Housing/Living Arrangement: Stable Housing Who is in the Home:  Boyfriend  Emergency Contact:  Dad   Museum/gallery curator  Financial: Private Insurance   Patient's Strengths and Goals  Patient's Strengths and Goals (patient's own words):  Patient has good relationship with father. Patient has insurance through father who assists with medical bills. Patient is currently employed.   Clinical Social  Worker's Interpretive Summary  Clinical Social Workers Interpretive Summary:    CSW received referral due to overdose attempt. CSW reviewed chart and met with patient at bedside. CSW introduced myself and explained role. Patient laying in bed and agreeable to assessment.  Patient lives in [place] with her boyfriend of 3 years. Patient's mother and father separated about 1 year ago due to mother's alcohol addiction. Patient feels that mother hates her and does not have any contact with mother but has good relationship with father. Patient reports that relationship with boyfriend is good overall but has "up and downs" at times. Patient does not enjoy her current job because she feels her coworkers are not supportive of her needs.  Patient was first diagnosed with bipolar disorder when she was 24 years old. Patient feels that she is a disappointment to others and that no one cares about her needs. Patient does not feel comfortable talking with boyfriend or father about bipolar symptoms despite reporting they are her only strong supports at this time. Patient has had 8-9 suicide attempts in the past and was currently feeling overwhelmed with current situation. Patient is not happy with her job, has been spending money irresponsibly and was unable to pay bills which triggered an argument with boyfriend. Patient continues to have low self-esteem and reports AH telling her that she was not a good person and should harm herself.  Patient has moved from [facility] to [place] and back to [facility]. Patient was married in [place] but husband cheated on her and kicked her out of the house. Patient moved back to [place] about 3 years ago but has not established any good friendships here. Patient recently overdosed in December 2015 and was admitted to [facility]. Patient enjoyed group sessions but boyfriend became jealous of her relationships with new people. Patient does not feel a strong connection with outpatient psychiatrist and does not feel he  is managing her symptoms well.   Patient requesting inpatient hospitalization at [facility]. CSW encouraged patient to develop positive boundaries with boyfriend, increase social support, and look for alternative medical team if she is unhappy with community psychiatrist.  Patient engaged during assessment but tearful. Patient reports that she wants to feel better but feels that her family believes that depression is not "a real medical condition" and does not need treatment. Patient reports she has reached out for help when feeling suicidal but it is always a reactive response.    Disposition  Disposition: Recommend Psych CSW Continuing To Support While In [facility]

## 2015-02-13 ENCOUNTER — Inpatient Hospital Stay (HOSPITAL_COMMUNITY)
Admission: AD | Admit: 2015-02-13 | Discharge: 2015-02-17 | DRG: 885 | Disposition: A | Discharge status: Home / Self Care | Payer: 59 | Source: Intra-hospital | Attending: Psychiatry | Admitting: Psychiatry

## 2015-02-13 ENCOUNTER — Encounter (HOSPITAL_COMMUNITY): Payer: Self-pay | Admitting: *Deleted

## 2015-02-13 DIAGNOSIS — T50901A Poisoning by unspecified drugs, medicaments and biological substances, accidental (unintentional), initial encounter: Secondary | ICD-10-CM | POA: Diagnosis present

## 2015-02-13 DIAGNOSIS — F3160 Bipolar disorder, current episode mixed, unspecified: Principal | ICD-10-CM | POA: Diagnosis present

## 2015-02-13 DIAGNOSIS — T450X2A Poisoning by antiallergic and antiemetic drugs, intentional self-harm, initial encounter: Secondary | ICD-10-CM | POA: Diagnosis not present

## 2015-02-13 DIAGNOSIS — Z87891 Personal history of nicotine dependence: Secondary | ICD-10-CM | POA: Diagnosis not present

## 2015-02-13 DIAGNOSIS — X58XXXA Exposure to other specified factors, initial encounter: Secondary | ICD-10-CM | POA: Diagnosis not present

## 2015-02-13 DIAGNOSIS — Z79899 Other long term (current) drug therapy: Secondary | ICD-10-CM | POA: Diagnosis not present

## 2015-02-13 DIAGNOSIS — R45851 Suicidal ideations: Secondary | ICD-10-CM | POA: Diagnosis present

## 2015-02-13 DIAGNOSIS — Z915 Personal history of self-harm: Secondary | ICD-10-CM | POA: Diagnosis not present

## 2015-02-13 DIAGNOSIS — F3130 Bipolar disorder, current episode depressed, mild or moderate severity, unspecified: Secondary | ICD-10-CM | POA: Diagnosis present

## 2015-02-13 DIAGNOSIS — T39312A Poisoning by propionic acid derivatives, intentional self-harm, initial encounter: Secondary | ICD-10-CM | POA: Diagnosis not present

## 2015-02-13 DIAGNOSIS — F319 Bipolar disorder, unspecified: Secondary | ICD-10-CM | POA: Diagnosis present

## 2015-02-13 DIAGNOSIS — F3164 Bipolar disorder, current episode mixed, severe, with psychotic features: Secondary | ICD-10-CM | POA: Diagnosis not present

## 2015-02-13 DIAGNOSIS — T426X2A Poisoning by other antiepileptic and sedative-hypnotic drugs, intentional self-harm, initial encounter: Secondary | ICD-10-CM | POA: Diagnosis not present

## 2015-02-13 DIAGNOSIS — T1491 Suicide attempt: Secondary | ICD-10-CM | POA: Diagnosis not present

## 2015-02-13 MED ORDER — ONDANSETRON 4 MG PO TBDP
4.0000 mg | ORAL_TABLET | Freq: Three times a day (TID) | ORAL | Status: DC | PRN
  Administered 2015-02-13 – 2015-02-16 (×5): 4 mg via ORAL
  Filled 2015-02-13: qty 8
  Filled 2015-02-13 (×5): qty 1

## 2015-02-13 MED ORDER — LITHIUM CARBONATE 150 MG PO CAPS: 300.0000 mg | ORAL_CAPSULE | Freq: Two times a day (BID) | ORAL | Status: DC

## 2015-02-13 MED ORDER — DOXEPIN HCL 25 MG PO CAPS
25.0000 mg | ORAL_CAPSULE | Freq: Every evening | ORAL | Status: DC | PRN
  Administered 2015-02-13 (×2): 25 mg via ORAL
  Filled 2015-02-13 (×9): qty 1

## 2015-02-13 MED ORDER — MAGNESIUM HYDROXIDE 400 MG/5ML PO SUSP: 30.0000 mL | Freq: Every day | ORAL | Status: DC | PRN

## 2015-02-13 MED ORDER — ALUM & MAG HYDROXIDE-SIMETH 200-200-20 MG/5ML PO SUSP: 30.0000 mL | ORAL | Status: DC | PRN

## 2015-02-13 MED ORDER — ACETAMINOPHEN 325 MG PO TABS
325.0000 mg | ORAL_TABLET | ORAL | Status: DC | PRN
  Administered 2015-02-13: 325 mg via ORAL
  Filled 2015-02-13: qty 1

## 2015-02-13 MED ORDER — LITHIUM CARBONATE 300 MG PO CAPS
300.0000 mg | ORAL_CAPSULE | Freq: Two times a day (BID) | ORAL | Status: DC
  Administered 2015-02-13: 300 mg via ORAL
  Filled 2015-02-13 (×2): qty 1

## 2015-02-13 NOTE — Progress Notes (Signed)
[facility]. [facility] transport will be transferring patient. Patient visitor given all patient belongings. Patient educated and informed.

## 2015-02-13 NOTE — Discharge Summary (Signed)
Physician Discharge Summary  Elizabeth Little ZOX:[id number] DOB: 10/16/1990 DOA: 02/11/2015  PCP: No PCP Per Patient  Admit date: 02/11/2015 Discharge date: 02/13/2015  Time spent: 20 minutes  Recommendations for Outpatient Follow-up:  1. Transfer to Inpatient Psychiatry  Discharge Diagnoses:  Principal Problem:   Suicide attempt Active Problems:   Bipolar disorder, current episode mixed, severe, with psychotic features   Intentional drug overdose   Drug overdose   Discharge Condition: Stable  Diet recommendation: Regular  Filed Weights   02/11/15 1600  Weight: 71 kg (156 lb 8.4 oz)    History of present illness:  Please see admit h and p from 6/26 for details. Briefly, pt presented with suicide attempt after intentionally overdosing on ambien, benadryl, and ibuprofen. Poison control was involved, recommending observation for 4-6hrs. The patient was admitted for further work up.  Hospital Course:  1. Suicide attempt with intentional drug overdose 1. Patient was admitted to stepdown for closer monitoring 2. Pt was continued on aggressive hydration 3. Poison control was notified through ed, recommended observation for 4-6hrs 4. Psychiatry was consulted, recommending restarting lithium  bid and inpatient psychiatry 5. Continued suicide precautions 6. Pt had remained stable and is currently medically clear and appropriate for inpatient psychiatry 2. Delirium 1. Noted on admission, secondary to drug overdose 2. Much improved 3. Bipolar d/o 1. Per above, Psychiatry consult 4. Depression 1. Per above 5. DVT prophylaxis 1. lovenox subq while inpatient  Consultations:  Psychiatry  Discharge Exam: Filed Vitals:   02/13/15 0335 02/13/15 0400 02/13/15 0500 02/13/15 0738  BP:    119/77  Pulse:      Temp: 97.9 F (36.6 C)     TempSrc: Oral     Resp:  Height:      Weight:      SpO2:  97% 98% 100%    General: Awake, in nad Cardiovascular: regular, s1,  s2 Respiratory: normal resp effort, no wheezing  Discharge Instructions     Medication List    ASK your doctor about these medications        albuterol 108 (90 BASE) MCG/ACT inhaler  Commonly known as:  PROVENTIL HFA;VENTOLIN HFA  Inhale 2 puffs into the lungs every 6 (six) hours as needed for wheezing.     bisacodyl 10 MG suppository  Commonly known as:  BISACODYL LAXATIVE  Place 1 suppository (10 mg total) rectally as needed for moderate constipation.     clonazePAM 1 MG tablet  Commonly known as:  KLONOPIN  Take 1 tablet (1 mg total) by mouth at bedtime.     docusate sodium 100 MG capsule  Commonly known as:  COLACE  Take 1 capsule (100 mg total) by mouth 2 (two) times daily.     escitalopram 20 MG tablet  Commonly known as:  LEXAPRO  Take 20 mg by mouth at bedtime.     hydrOXYzine 25 MG tablet  Commonly known as:  ATARAX/VISTARIL  Take 1-2 tablets (25-50 mg total) by mouth every 6 (six) hours as needed for anxiety.     ibuprofen 600 MG tablet  Commonly known as:  ADVIL,MOTRIN  Take 1 tablet (600 mg total) by mouth every 8 (eight) hours as needed.     lactulose 10 GM/15ML solution  Commonly known as:  CHRONULAC  Take 15 mLs (10 g total) by mouth 2 (two) times daily.     lamoTRIgine 200 MG tablet  Commonly known as:  LAMICTAL  Take 200 mg by mouth at  bedtime.     levonorgestrel 0.75 MG tablet  Commonly known as:  PLAN B  Take 2 tablets (1.5 mg total) by mouth once.     norgestimate-ethinyl estradiol 0.25-35 MG-MCG tablet  Commonly known as:  ORTHO-CYCLEN,SPRINTEC,PREVIFEM  Take 1 tablet by mouth daily.     ondansetron 4 MG disintegrating tablet  Commonly known as:  ZOFRAN ODT  Take 1 tablet (4 mg total) by mouth every 8 (eight) hours as needed for nausea or vomiting.     risperidone 4 MG tablet  Commonly known as:  RISPERDAL  Take 4 mg by mouth at bedtime.       Allergies  Allergen Reactions  . 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 I get a rash and swollen lips. I need an epi-pen if I eat mint."  . Subutex [Buprenorphine] Shortness Of Breath    "Was unable to breath on my own"  . Dilaudid [Hydromorphone Hcl] Itching    Itches until she bleeds      The results of significant diagnostics from this hospitalization (including imaging, microbiology, ancillary and laboratory) are listed below for reference.    Significant Diagnostic Studies: No results found.  Microbiology: Recent Results (from the past 240 hour(s))  MRSA PCR Screening     Status: None   Collection Time: 02/11/15  4:00 PM  Result Value Ref Range Status   MRSA by PCR NEGATIVE NEGATIVE Final    Comment:        The GeneXpert MRSA Assay (FDA approved for NASAL specimens only), is one component of a comprehensive MRSA colonization surveillance program. It is not intended to diagnose MRSA infection nor to guide or monitor treatment for MRSA infections.      Labs: Basic Metabolic Panel:  Recent Labs Lab 02/11/15 1049 02/12/15 0345  NA 141 138  K 3.7 4.1  CL 108 109  CO2 20* 21*  GLUCOSE 109* 95  BUN 12 8  CREATININE 0.96 0.79  CALCIUM 9.3 8.6*   Liver Function Tests:  Recent Labs Lab 02/11/15 1049 02/12/15 0345  AST 19 13*  ALT 12* 10*  ALKPHOS 65 49  BILITOT 0.4 0.7  PROT 8.1 6.2*  ALBUMIN 4.7 3.6   No results for input(s): LIPASE, AMYLASE in the last 168 hours. No results for input(s): AMMONIA in the last 168 hours. CBC:  Recent Labs Lab 02/11/15 1049 02/12/15 0345  WBC 5.7 8.4  HGB 13.6 10.6*  HCT 41.7 33.0*  MCV 88.3 88.2  PLT 264 234   Cardiac Enzymes: No results for input(s): CKTOTAL, CKMB, CKMBINDEX, TROPONINI in the last 168 hours. BNP: BNP (last 3 results) No results for input(s): BNP in the last 8760 hours.  ProBNP (last 3 results) No results for input(s): PROBNP in the last 8760 hours.  CBG: No results for input(s): GLUCAP in the last 168  hours.   Signed:  CHIU, STEPHEN K  [facility] 02/13/2015, 7:43 AM

## 2015-02-13 NOTE — Progress Notes (Signed)
Clinical Social Work  Patient accepted to [facility] [phone]. RN to call report to [phone]. CSW met with patient and father at bedside. Patient agreeable to father being involved in assessment as well. CSW informed patient of transfer to [facility]. Patient reports she is happy to be getting more MH help. Patient signed voluntary form which was faxed to [facility] and original copy placed on chart.   CSW arranged transportation for [facility] at 4:30 pm per RN request. CSW is signing off but available if needed.  [place], [place] [phone]
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
## 2015-02-13 NOTE — Progress Notes (Signed)
Clinical Social Work  Per MD, patient medically stable to DC. CSW contacted the following facilities in search for bed availability.  [facility]- admissions is reviewing and will contact CSW if they can accept.  [facility]- [facility]) reviewing and will contact CSW if they can accept.  [facility]- no available beds  [facility]- message left with admissions  [facility]- admissions unsure about bed availability. Referral faxed.  [facility]- available beds. Referral faxed.  CSW will continue to follow.  Louviers[place]olly Danicka [place], [place] [phone]

## 2015-02-13 NOTE — Progress Notes (Signed)
Patient ID: Shellee [name], female   DOB: 02-26-1991, 24 y.o.   MRN: [id number] Pt is a 24 year old female admitted voluntarily after overdosing on a "handful of ambien, Ibuprofen and Benadry.  Pt currently sees Dr. Tomasa Randunningham for outpatient therapy.  She reports  A diagnosis of bipolar since age 24.  Pt has a previous SI attempt this past December 2015.  Pt. Reports her stressors are "work, finances, Everything."  Pt reports " I want to get my medications adjusted."  Pt. Currently lives with her boyfriend.

## 2015-02-13 NOTE — Progress Notes (Signed)
[facility] Group Notes:  (Nursing/MHT/Case Management/Adjunct)  Date:  02/13/2015  Time:  8:57 PM  Type of Therapy:  Psychoeducational Skills  Participation Level:  Active  Participation Quality:  Appropriate  Affect:  Appropriate  Cognitive:  Appropriate  Insight:  Appropriate  Engagement in Group:  Engaged  Modes of Intervention:  Discussion  Summary of Progress/Problems: Tonight in wrap up group Elizabeth Little stated that today has been a 4 for her she was able to get some sleep and got off of machines they placed her on at a different hospital.  Elizabeth Little 02/13/2015, 8:57 PM

## 2015-02-13 NOTE — Tx Team (Signed)
Initial Interdisciplinary Treatment Plan   PATIENT STRESSORS: Financial difficulties Marital or family conflict   PATIENT STRENGTHS: Active sense of humor Average or above average intelligence Motivation for treatment/growth   PROBLEM LIST: Problem List/Patient Goals Date to be addressed Date deferred Reason deferred Estimated date of resolution  "Pt reports stressors as work, finances and just Everything." Pt reports " I want to get my medications adjusted."  02/13/15     Risk for self injury (overdosed) 02/13/15     Alteration in mood 02/13/15                                          DISCHARGE CRITERIA:  Improved stabilization in mood, thinking, and/or behavior Need for constant or close observation no longer present  PRELIMINARY DISCHARGE PLAN: Return to previous living arrangement  PATIENT/FAMIILY INVOLVEMENT: This treatment plan has been presented to and reviewed with the patient, Elizabeth Little. Rudi RummageWhitaker, Jadzia Ibsen Oakley 02/13/2015, 7:24 PM

## 2015-02-13 NOTE — Progress Notes (Signed)
Bed requests have been faxed out per [place], [place]. Awaiting answer before transfer upstairs.

## 2015-02-13 NOTE — Progress Notes (Signed)
02/12/2015 1930 Pt's Father and friend arrived to visit. RN asked visitors to place their belongings outside of room on chair within their view.  Also asked visitors to await security to be wanded prior to visiting. Visitors agreed, waited outside room, security arrived and [employer] Energywanded visitors. Visitors entered room.  2020 Pt's boyfriend arrived, security called to wand visitor. Security arrived, visitor wanded, keys kept in pocket, entered room. Pt's boyfriend brought bag with Pt's clothes.  I took this bag and another canvas bag of belongings from room to [facility] and placed BoeingPt sticker. 2025 Pt's Father and friend informed only 2 visitors per room, Father stated they were about to leave and then left several minutes later.

## 2015-02-14 DIAGNOSIS — R45851 Suicidal ideations: Secondary | ICD-10-CM

## 2015-02-14 DIAGNOSIS — T426X2A Poisoning by other antiepileptic and sedative-hypnotic drugs, intentional self-harm, initial encounter: Secondary | ICD-10-CM

## 2015-02-14 DIAGNOSIS — T450X2A Poisoning by antiallergic and antiemetic drugs, intentional self-harm, initial encounter: Secondary | ICD-10-CM

## 2015-02-14 DIAGNOSIS — T39312A Poisoning by propionic acid derivatives, intentional self-harm, initial encounter: Secondary | ICD-10-CM

## 2015-02-14 DIAGNOSIS — T1491 Suicide attempt: Secondary | ICD-10-CM

## 2015-02-14 MED ORDER — DOXEPIN HCL 25 MG PO CAPS
25.0000 mg | ORAL_CAPSULE | Freq: Once | ORAL | Status: AC
  Administered 2015-02-14: 25 mg via ORAL
  Filled 2015-02-14 (×2): qty 1

## 2015-02-14 MED ORDER — LURASIDONE HCL 40 MG PO TABS
20.0000 mg | ORAL_TABLET | Freq: Every day | ORAL | Status: DC
  Administered 2015-02-14 – 2015-02-15 (×2): 20 mg via ORAL
  Filled 2015-02-14 (×5): qty 1

## 2015-02-14 MED ORDER — LITHIUM CARBONATE 150 MG PO CAPS
150.0000 mg | ORAL_CAPSULE | Freq: Two times a day (BID) | ORAL | Status: DC
  Administered 2015-02-14 – 2015-02-15 (×2): 150 mg via ORAL
  Filled 2015-02-14 (×7): qty 1

## 2015-02-14 MED ORDER — LORAZEPAM 0.5 MG PO TABS
0.5000 mg | ORAL_TABLET | Freq: Four times a day (QID) | ORAL | Status: DC | PRN
  Administered 2015-02-14: 0.5 mg via ORAL
  Filled 2015-02-14: qty 1

## 2015-02-14 NOTE — H&P (Signed)
Psychiatric Admission Assessment Adult  Patient Identification: Elizabeth Little MRN:  [id number] Date of Evaluation:  02/14/2015 Chief Complaint:  Bipolar Disorder Principal Diagnosis: <principal problem not specified> Diagnosis:   Patient Active Problem List   Diagnosis Date Noted  . Overdose [T50.901A] 02/13/2015  . Suicide attempt [T14.91] 02/11/2015  . Intentional drug overdose [T50.902A] 02/11/2015  . Drug overdose [T50.901A] 02/11/2015  . Adverse reaction to hormonal drug [T38.805A] 12/04/2014  . Complex regional pain syndrome of lower limb [G90.529] 12/04/2014  . Ovarian cyst [N83.20] 12/01/2014  . Anxiety [F41.9] 12/01/2014  . Bipolar I disorder, most recent episode depressed [F31.30]   . PTSD (post-traumatic stress disorder) [F43.10] 07/25/2014  . Bipolar disorder, current episode mixed, severe, with psychotic features [F31.64]   . Asthma [J45.909] 11/11/2012  . Dysmenorrhea [N94.6] 11/11/2012   History of Present Illness: Elizabeth Little is a 24 y.o. female patient admitted with intentional drug overdose on Ambien, Benadryl and Ibuprofen.  States she did not want to live.  She is known to [facility], last admitted 12/15.  Patient told the past few months she's been experiencing increased mania and severe depression. Her triggers include being unhappy with her psychiatrist Dr. Candis Schatz and having her medicine changed multiple times in past 3 months. She endorsed last week she has severe mania when she was spending too much money and very impulsive and aggressive and now she is crashing into severe depression.  She is taking Risperdal, Lamictal and Lexapro does not believe her medicine were effective.  She is working full-time at [employer] but does not like her job.  Patient has history of cutting herself and she remember last time she cut herself in December 2015. She admitted history of drinking and smoking marijuana but she has not done in recent months.  HPI Elements:  Location: Severe depression and having suicidal thoughts with anhedonia, lability. Quality: Severe and unable to function. Severity: Having suicidal thoughts and attempted to take her life. Duration: Ongoing but more severe in past one week.  Associated Signs/Symptoms: Depression Symptoms:  depressed mood, anhedonia, insomnia, fatigue, difficulty concentrating, hopelessness, (Hypo) Manic Symptoms:  Hallucinations, Impulsivity, Irritable Mood, Labiality of Mood, Anxiety Symptoms:  Social Anxiety, Psychotic Symptoms:  NA PTSD Symptoms: NA Total Time spent with patient: 45 minutes  Past Medical History:  Past Medical History  Diagnosis Date  . Asthma   . RSD (reflex sympathetic dystrophy)   . Ovarian cyst   . Bipolar 1 disorder   . Vertigo   . Anemia   . CRPS (complex regional pain syndrome)   . Anxiety   . Depression     Past Surgical History  Procedure Laterality Date  . Cholecystectomy     Family History:  Family History  Problem Relation Age of Onset  . Mental illness Mother   . Mental illness Father   . Mental illness Brother   . Asthma Brother    Social History:  History  Alcohol Use No    Comment: Rarely, Less than monthly     History  Drug Use  . Yes  . Special: Marijuana    History   Social History  . Marital Status: Married    Spouse Name: N/A  . Number of Children: N/A  . Years of Education: N/A   Social History Main Topics  . Smoking status: Former Research scientist (life sciences)  . Smokeless tobacco: Never Used  . Alcohol Use: No     Comment: Rarely, Less than monthly  . Drug Use: Yes  Special: Marijuana  . Sexual Activity: Yes    Birth Control/ Protection: None     Comment: intercourse age 70, sexual partners more than 5   Other Topics Concern  . None   Social History Narrative   Additional Social History:    History of alcohol / drug use?: Yes (marijuana)   Musculoskeletal: Strength & Muscle Tone: within normal limits Gait & Station:  normal Patient leans: N/A  Psychiatric Specialty Exam: Physical Exam  Vitals reviewed.   ROS  Blood pressure 120/71, pulse 121, temperature 97.8 F (36.6 C), temperature source Oral, resp. rate 18, height 5' 5.5" (1.664 m), weight 68.493 kg (151 lb), last menstrual period 01/09/2015.Body mass index is 24.74 kg/(m^2).   General Appearance: Disheveled and Tearful and crying  Eye Contact:: Fair  Speech: Pressured and Rambling  Volume: Increased  Mood: Angry, Hopeless, Irritable and Worthless  Affect: Labile and Tearful  Thought Process: Coherent  Orientation: Full (Time, Place, and Person)  Thought Content: Hallucinations: Auditory and Paranoid Ideation  Suicidal Thoughts: Yes. with intent/plan  Homicidal Thoughts: No  Memory: Immediate; Fair Recent; Fair Remote; Fair  Judgement: Impaired  Insight: Lacking  Psychomotor Activity: Increased  Concentration: Fair  Recall: [employer] of Knowledge:Fair  Language: Fair  Akathisia: No  Handed: Right  AIMS (if indicated):    Assets: Communication Skills  ADL's: Intact  Cognition: WNL  Sleep:  2 hrs       Risk to Self: Is patient at risk for suicide?: Yes What has been your use of drugs/alcohol within the last 12 months?: None Risk to Others:   Prior Inpatient Therapy:   Prior Outpatient Therapy:    Alcohol Screening: 1. How often do you have a drink containing alcohol?: Never 9. Have you or someone else been injured as a result of your drinking?: No 10. Has a relative or friend or a doctor or another health worker been concerned about your drinking or suggested you cut down?: No Alcohol Use Disorder Identification Test Final Score (AUDIT): 0 Brief Intervention: Patient declined brief intervention  Allergies:   Allergies  Allergen Reactions  . Mint Chocolate Chip Flavor [Flavoring Agent] Swelling and Rash    Pt reports "I am allergic to mint. I get a rash and swollen  lips. I need an epi-pen if I eat mint."  . Subutex [Buprenorphine] Shortness Of Breath    "Was unable to breath on my own"  . Dilaudid [Hydromorphone Hcl] Itching    Itches until she bleeds   Lab Results: No results found for this or any previous visit (from the past 48 hour(s)). Current Medications: Current Facility-Administered Medications  Medication Dose Route Frequency Provider Last Rate Last Dose  . alum & mag hydroxide-simeth (MAALOX/MYLANTA) 200-200-20 MG/5ML suspension 30 mL  30 mL Oral Q4H PRN Benjamine Mola, FNP      . doxepin (SINEQUAN) capsule 25 mg  25 mg Oral QHS,MR X 1 Laverle Hobby, PA-C   25 mg at 02/13/15 2304  . magnesium hydroxide (MILK OF MAGNESIA) suspension 30 mL  30 mL Oral Daily PRN Benjamine Mola, FNP      . ondansetron (ZOFRAN-ODT) disintegrating tablet 4 mg  4 mg Oral Q8H PRN Laverle Hobby, PA-C   4 mg at 02/14/15 4782   PTA Medications: Prescriptions prior to admission  Medication Sig Dispense Refill Last Dose  . escitalopram (LEXAPRO) 20 MG tablet Take 20 mg by mouth daily.   02/10/2015  . hydrOXYzine (ATARAX/VISTARIL) 25 MG tablet  Take 25-50 mg by mouth every 6 (six) hours as needed for anxiety.   Past Week at Unknown time  . ibuprofen (ADVIL,MOTRIN) 600 MG tablet Take 600 mg by mouth every 8 (eight) hours as needed for moderate pain.   02/11/2015  . lamoTRIgine (LAMICTAL) 200 MG tablet Take 200 mg by mouth at bedtime.   02/10/2015  . norgestimate-ethinyl estradiol (ORTHO-CYCLEN,SPRINTEC,PREVIFEM) 0.25-35 MG-MCG tablet Take 1 tablet by mouth daily.   02/10/2015  . risperiDONE (RISPERDAL) 2 MG tablet Take 4 mg by mouth at bedtime.   02/10/2015  . albuterol (PROVENTIL HFA;VENTOLIN HFA) 108 (90 BASE) MCG/ACT inhaler Inhale 2 puffs into the lungs every 6 (six) hours as needed for wheezing. 1 Inhaler 0 Unknown at Unknown time  . bisacodyl (BISACODYL LAXATIVE) 10 MG suppository Place 1 suppository (10 mg total) rectally as needed for moderate constipation. 12  suppository 0 Unknown at Unknown time  . docusate sodium (COLACE) 100 MG capsule Take 1 capsule (100 mg total) by mouth 2 (two) times daily. (Patient taking differently: Take 100 mg by mouth 2 (two) times daily as needed for mild constipation or moderate constipation. ) 10 capsule 0 Unknown at Unknown time  . lactulose (CHRONULAC) 10 GM/15ML solution Take 15 mLs (10 g total) by mouth 2 (two) times daily. (Patient taking differently: Take 10 g by mouth 2 (two) times daily as needed for severe constipation. ) 240 mL 0 Unknown at Unknown time  . lithium carbonate 150 MG capsule Take 2 capsules (300 mg total) by mouth 2 (two) times daily with a meal. 30 capsule 0     Previous Psychotropic Medications: Yes   Substance Abuse History in the last 12 months:  No.    Consequences of Substance Abuse: NA  Results for orders placed or performed during the hospital encounter of 02/11/15 (from the past 72 hour(s))  MRSA PCR Screening     Status: None   Collection Time: 02/11/15  4:00 PM  Result Value Ref Range   MRSA by PCR NEGATIVE NEGATIVE    Comment:        The GeneXpert MRSA Assay (FDA approved for NASAL specimens only), is one component of a comprehensive MRSA colonization surveillance program. It is not intended to diagnose MRSA infection nor to guide or monitor treatment for MRSA infections.   Lactic acid, plasma     Status: None   Collection Time: 02/11/15  5:16 PM  Result Value Ref Range   Lactic Acid, Venous 0.8 0.5 - 2.0 mmol/L  Acetaminophen level     Status: Abnormal   Collection Time: 02/11/15  6:30 PM  Result Value Ref Range   Acetaminophen (Tylenol), Serum <10 (L) 10 - 30 ug/mL    Comment:        THERAPEUTIC CONCENTRATIONS VARY SIGNIFICANTLY. A RANGE OF 10-30 ug/mL MAY BE AN EFFECTIVE CONCENTRATION FOR MANY PATIENTS. HOWEVER, SOME ARE BEST TREATED AT CONCENTRATIONS OUTSIDE THIS RANGE. ACETAMINOPHEN CONCENTRATIONS >150 ug/mL AT 4 HOURS AFTER INGESTION AND >50 ug/mL AT  12 HOURS AFTER INGESTION ARE OFTEN ASSOCIATED WITH TOXIC REACTIONS.   Comprehensive metabolic panel     Status: Abnormal   Collection Time: 02/12/15  3:45 AM  Result Value Ref Range   Sodium 138 135 - 145 mmol/L   Potassium 4.1 3.5 - 5.1 mmol/L   Chloride 109 101 - 111 mmol/L   CO2 21 (L) 22 - 32 mmol/L   Glucose, Bld 95 65 - 99 mg/dL   BUN 8 6 - 20 mg/dL  Creatinine, Ser 0.79 0.44 - 1.00 mg/dL   Calcium 8.6 (L) 8.9 - 10.3 mg/dL   Total Protein 6.2 (L) 6.5 - 8.1 g/dL   Albumin 3.6 3.5 - 5.0 g/dL   AST 13 (L) 15 - 41 U/L   ALT 10 (L) 14 - 54 U/L   Alkaline Phosphatase 49 38 - 126 U/L   Total Bilirubin 0.7 0.3 - 1.2 mg/dL   GFR calc non Af Amer >60 >60 mL/min   GFR calc Af Amer >60 >60 mL/min    Comment: (NOTE) The eGFR has been calculated using the CKD EPI equation. This calculation has not been validated in all clinical situations. eGFR's persistently <60 mL/min signify possible Chronic Kidney Disease.    Anion gap 8 5 - 15  CBC     Status: Abnormal   Collection Time: 02/12/15  3:45 AM  Result Value Ref Range   WBC 8.4 4.0 - 10.5 K/uL   RBC 3.74 (L) 3.87 - 5.11 MIL/uL   Hemoglobin 10.6 (L) 12.0 - 15.0 g/dL    Comment: DELTA CHECK NOTED REPEATED TO VERIFY    HCT 33.0 (L) 36.0 - 46.0 %   MCV 88.2 78.0 - 100.0 fL   MCH 28.3 26.0 - 34.0 pg   MCHC 32.1 30.0 - 36.0 g/dL   RDW 13.3 11.5 - 15.5 %   Platelets 234 150 - 400 K/uL    Observation Level/Precautions:  15 minute checks  Laboratory:  per ED  Psychotherapy:  Group  Medications:  As per medlist  Consultations:  As needed  Discharge Concerns:  Safety    Estimated LOS:  5-7 days  Other:     Psychological Evaluations: Yes   Treatment Plan Summary: 1.  Take all your medications as prescribed.              2.  Report any adverse side effects to outpatient provider.                       3.  Patient instructed to not use alcohol or illegal drugs while on prescription medicines.            4.  In the event of  worsening symptoms, instructed patient to call 911, the crisis hotline or go to nearest emergency room for evaluation of symptoms.  Medical Decision Making:  Review of Psycho-Social Stressors (1), Discuss test with performing physician (1), Decision to obtain old records (1), Review of Medication Regimen & Side Effects (2) and Review of New Medication or Change in Dosage (2)  I certify that inpatient services furnished can reasonably be expected to improve the patient's condition.   Elizabeth Little May Agustin AGNP-BC 6/29/20163:05 PM   I have discussed case with NP and have met with patient Agree with NP Note and Assessment 24 year old female, lives with boyfriend, no children, employed, who is known to our unit from a previous psychiatric admission back in December 2015, also for severe depression and suicidal attempt by overdosing. She states she felt better for a period of time but over recent weeks to months has again been feeling more depressed, subjectively overwhelmed, sad. She also has a history of hallucinations , and states she hears voices telling her " you are no good, you are wasting space ". She recently overdosed on a combination of Ambien, Benadryl, ibuprofen, after which she came to hospital . ( This was three days ago, had a brief admission to ICU prior to being  medically cleared ) . She states that overdose was impulsive . Denies alcohol or drug abuse . Most recently had been taking Lamictal 200 mgrs QDAY, Risperidone 4 mgrs QDAY, Lexapro 20 mgrs QDAY , and was taking these medications regularly, but states " I was still getting worse ". Dx- Bipolar Disorder, Depressed . Plan- Start Latuda 20 mgrs QDAY for mood disorder, psychosis, start LiCO3 for Mood Disorder- will start low dose due to history of nausea during a prior lithium trial , due to which she stopped ( now states " i think I should have given it a chance "), Ativan 0.5 mgrs Q 6 hours PRN Anxiety

## 2015-02-14 NOTE — [facility] Group Notes (Signed)
[facility] LCSW Group Therapy  Emotional Regulation 1:15 - 2: 30 PM        02/14/2015  3:10 PM    Type of Therapy:  Group Therapy  Participation Level:  Minimal  Participation Quality: Minimal  Affect:  Depressed, Flat  Cognitive: Appropriate  Insight:  Developing/Improving   Engagement in Therapy:  Developing/Improving  Modes of Intervention:  Discussion Exploration Problem-Solving Supportive  Summary of Progress/Problems:  Group topic was emotional regulations.  Patient did not engage in the discussion.    [name], Terrence Pizana Hairston 02/14/2015 3:10 PM

## 2015-02-14 NOTE — Progress Notes (Signed)
Adult Psychoeducational Group Note  Date:  02/14/2015 Time:  9:44 PM  Group Topic/Focus:  Wrap-Up Group:   The focus of this group is to help patients review their daily goal of treatment and discuss progress on daily workbooks.  Participation Level:  Active  Participation Quality:  Appropriate  Affect:  Appropriate  Cognitive:  Appropriate  Insight: Appropriate  Engagement in Group:  Engaged  Modes of Intervention:  Discussion  Additional Comments: The patient expressed that she attended group.The patient also said that she had a rough day with medication changes.  Octavio Mannshigpen, Kiyo Heal Lee 02/14/2015, 9:44 PM

## 2015-02-14 NOTE — Progress Notes (Signed)
D: Pt mood is hopeless and affect is tearful.  Pt stated to RN, "I don't want to be alive."  Pt did not have a plan.   Pt stated experiencing auditory hallucinations which tell pt "I'm awful and shouldn't be alive."  Pt denies visual hallucinations.  Pt complained of nausea.  Pt crying this morning.  Pt stated that pt has a previous diagnosis of anorexia.  A: Patient given emotional support from RN. Patient encouraged to come to staff with concerns and/or questions. Patient's medication routine continued. Patient's orders and plan of care reviewed. Will continue to monitor patient q15 minutes for safety.   PT stated would approach staff if having thoughts of self-harm.  Pt stated that anti-emetic administered by previous RN decreased nausea.  One on one time taken with pt to comfort pt about pt's distressing thoughts.  RN educated pt on exercise for increased self-esteem.     R: Patient remains appropriate and cooperative.  Pt stated still feeling some nausea.  Pt declined advice to exercise and stated that due to pt's previous anorexia diagnosis, she does not want to workout and "get back into old habits."

## 2015-02-14 NOTE — Plan of Care (Signed)
Problem: Alteration in mood Goal: STG-Patient reports thoughts of self-harm to staff Outcome: Progressing Pt endorses SI on admission. Pt contract to come to staff if feeling unsafe.

## 2015-02-14 NOTE — [facility] Suicide Risk Assessment (Addendum)
[facility]   Nursing information obtained from:  Patient Demographic factors:  Adolescent or young adult, Caucasian, Low socioeconomic status Current Mental Status:  Suicidal ideation indicated by patient, Self-harm thoughts Loss Factors:  Financial problems / change in socioeconomic status Historical Factors:  Prior suicide attempts Risk Reduction Factors:  Employed, Living with another person, especially a relative Total Time spent with patient: 45 minutes Principal Problem: Bipolar Disorder  Diagnosis:   Patient Active Problem List   Diagnosis Date Noted  . Overdose [T50.901A] 02/13/2015  . Suicide attempt [T14.91] 02/11/2015  . Intentional drug overdose [T50.902A] 02/11/2015  . Drug overdose [T50.901A] 02/11/2015  . Adverse reaction to hormonal drug [T38.805A] 12/04/2014  . Complex regional pain syndrome of lower limb [G90.529] 12/04/2014  . Ovarian cyst [N83.20] 12/01/2014  . Anxiety [F41.9] 12/01/2014  . Bipolar I disorder, most recent episode depressed [F31.30]   . PTSD (post-traumatic stress disorder) [F43.10] 07/25/2014  . Bipolar disorder, current episode mixed, severe, with psychotic features [F31.64]   . Asthma [J45.909] 11/11/2012  . Dysmenorrhea [N94.6] 11/11/2012     Continued Clinical Symptoms:  Alcohol Use Disorder Identification Test Final Score (AUDIT): 0 The "Alcohol Use Disorders Identification Test", Guidelines for Use in Primary Care, Second Edition.  World Science writerHealth Organization [facility](WHO). Score between 0-7:  no or low risk or alcohol related problems. Score between 8-15:  moderate risk of alcohol related problems. Score between 16-19:  high risk of alcohol related problems. Score 20 or above:  warrants further diagnostic evaluation for alcohol dependence and treatment.   CLINICAL FACTORS:  24 year old female, lives with boyfriend, no children, employed, who is known to our unit from a previous psychiatric admission back in December  2015, also for severe depression and suicidal attempt by overdosing. She states she felt better for a period of time but over recent weeks to months has again been feeling more depressed, subjectively overwhelmed, sad. She also has a history of hallucinations , and states she hears voices telling her " you are no good, you are wasting space ". She recently overdosed on a combination of Ambien, Benadryl, ibuprofen, after which she came to hospital . ( This was three days ago, had a brief admission to ICU prior to being medically cleared ) . She states that overdose was impulsive . Denies alcohol or drug abuse . Most recently had been taking Lamictal 200 mgrs QDAY, Risperidone 4 mgrs QDAY, Lexapro 20 mgrs QDAY , and was taking these medications regularly, but states " I was still getting worse ". Dx- Bipolar Disorder, Depressed . Plan- Start Latuda 20 mgrs QDAY for mood disorder, psychosis, start LiCO3 for Mood Disorder- will start low dose due to history of nausea during a prior lithium trial , due to which she stopped ( now states " i think I should have given it a chance "), Ativan 0.5 mgrs Q 6 hours PRN Anxiety   Musculoskeletal: Strength & Muscle Tone: within normal limits Gait & Station: normal Patient leans: N/A  Psychiatric Specialty Exam: Physical Exam  Review of Systems  Constitutional: Negative.   HENT: Negative.   Eyes: Negative.   Cardiovascular: Negative.   Gastrointestinal: Negative.   Genitourinary: Negative.   Musculoskeletal: Negative.        Chronic ankle discomfort , pain, stemming from trauma years ago   Skin: Negative.   Neurological: Negative.   Endo/Heme/Allergies: Negative.   Psychiatric/Behavioral: Positive for depression and suicidal ideas. The patient is nervous/anxious.   all other  systems negative   Blood pressure 120/71, pulse 121, temperature 97.8 F (36.6 C), temperature source Oral, resp. rate 18, height 5' 5.5" (1.664 m), weight 151 lb (68.493 kg), last  menstrual period 01/09/2015.Body mass index is 24.74 kg/(m^2).  General Appearance: Fairly Groomed  Patent attorney::  Good  Speech:  Normal Rate  Volume:  Normal  Mood:  Depressed  Affect:  Constricted and Tearful  Thought Process:  Linear  Orientation:  Full (Time, Place, and Person)  Thought Content:  (+) demeaning , deprecating auditory hallucinations, does not appear internally preoccupied , no delusions expressed   Suicidal Thoughts:  No- at this time denies any ongoing thoughts of hurting or killing self   Homicidal Thoughts:  No  Memory:  recent and remote grossly intact   Judgement:  Fair  Insight:  Fair  Psychomotor Activity:  Normal  Concentration:  Good  Recall:  Good  Fund of Knowledge:Good  Language: Good  Akathisia:  Negative  Handed:  Right  AIMS (if indicated):     Assets:  Desire for Improvement Physical Health Resilience  Sleep:     Cognition: WNL  ADL's:  Impaired     COGNITIVE FEATURES THAT CONTRIBUTE TO RISK:  Loss of executive function    SUICIDE RISK:   Moderate:  Frequent suicidal ideation with limited intensity, and duration, some specificity in terms of plans, no associated intent, good self-control, limited dysphoria/symptomatology, some risk factors present, and identifiable protective factors, including available and accessible social support.  PLAN OF CARE: Patient will be admitted to inpatient psychiatric unit for stabilization and safety. Will provide and encourage milieu participation. Provide medication management and maked adjustments as needed.  Will follow daily.    Medical Decision Making:  Review of Psycho-Social Stressors (1), Review or order clinical lab tests (1), Established Problem, Worsening (2) and Review of Medication Regimen & Side Effects (2)  I certify that inpatient services furnished can reasonably be expected to improve the patient's condition.   COBOS, FERNANDO 02/14/2015, 4:30 PM

## 2015-02-14 NOTE — [facility] Group Notes (Signed)
[facility]   02/14/2015  3:08 PM    Participation Quality:  Appropraite  Mood/Affect:  Appropriate  Depression Rating:  10  Anxiety Rating:  10  Thoughts of Suicide:  No  Will you contract for safety?   NA  Current AVH:  No  Plan for Discharge/Comments:  Patient attended discharge planning group and actively participated in group. She advised of being admitted due to depression and an overdose.  She reports she was being seen at [facility] but does not want to return there for services.  Patient and CSW to explore other options. Suicide prevention education reviewed and SPE document provided.   Transportation Means: Patient has transportation.   Supports:  Patient has a support system.   Shaketta Rill, Joesph JulyQuylle Hairston

## 2015-02-14 NOTE — Progress Notes (Signed)
Recreation Therapy Notes  Date: 06.29.16 Time: 9:30 am Location: [address] Dayroom  Group Topic: Stress Management  Goal Area(s) Addresses:  Patient will verbalize importance of using healthy stress management.  Patient will identify positive emotions associated with healthy stress management.   Intervention: Stress Management  Activity : Progressive Muscle Relaxation. LRT introduced and educated patients on stress management technique of progressive muscle relaxation. A script was used to to deliver the technique to patients. Patients were asked to follow script read aloud by LRT to engage in the stress management technique.  Education: Stress Management, Discharge Planning.   Education Outcome: Acknowledges edcuation/In group clarification offered/Needs additional education  Clinical Observations/Feedback: Patient did not attend group.  Caroll RancherMarjette Bassam Dresch , LRT/CTRS         Caroll RancherLindsay, Alfreddie Consalvo A 02/14/2015 3:19 PM
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
## 2015-02-14 NOTE — Progress Notes (Signed)
D:Patient in he hallway on approach.  Patient states she had a bad day because he medications were not started until late today.  Patient states her anxiety has been high.  Patient states she is passive SI but verbally contracts for safety.  Patient denies HI  Patient states she has auditory hallucinations.  Also patient states she has difficulty sleeping at night. A: Staff to monitor Q 15 mins for safety.  Encouragement and support offered.  Scheduled medications administered per orders. R: Patient remains safe on the unit.  Patient attended group tonight.  Patient visible on the unit and interacting with peers.  Patient taking adminisitered medications.

## 2015-02-14 NOTE — Progress Notes (Signed)
Patient ID: Elizabeth Little, female   DOB: 05-09-1991, 24 y.o.   MRN: [id number] D: Patient crying reports she feel hopeless. Pt reports her medication was not working. Pt stated her doctor was not listening to her about how she was feeling. Pt reports her medication has been increase multiple times. Pt mood and affect appeared depressed and flat. Pt endorses suicidal ideation and contract to come to staff. Pt reports auditory hallucination telling her she is not worth anything. Cooperative with assessment. No acute distressed noted at this time.   A: Met with pt 1:1. Medications administered as prescribed. Support and encouragement provider to attend groups and engage in milieu. Pt encouraged to discuss feelings and come to staff with any question or concerns.   R: Patient remains safe. She is complaint with medications.

## 2015-02-15 LAB — LIPID PANEL
CHOL/HDL RATIO: 3.2 ratio
Cholesterol: 188 mg/dL (ref 0–200)
HDL: 58 mg/dL (ref >40)
LDL Cholesterol: 114 mg/dL — ABNORMAL HIGH (ref 0–99)
TRIGLYCERIDES: 81 mg/dL (ref <150)
VLDL: 16 mg/dL (ref 0–40)

## 2015-02-15 LAB — TSH: TSH: 1.181 u[IU]/mL (ref 0.350–4.500)

## 2015-02-15 MED ORDER — LURASIDONE HCL 40 MG PO TABS
40.0000 mg | ORAL_TABLET | Freq: Every day | ORAL | Status: DC
  Administered 2015-02-16 – 2015-02-17 (×2): 40 mg via ORAL
  Filled 2015-02-15: qty 5
  Filled 2015-02-15 (×4): qty 1

## 2015-02-15 MED ORDER — IBUPROFEN 600 MG PO TABS
600.0000 mg | ORAL_TABLET | Freq: Four times a day (QID) | ORAL | Status: DC | PRN
  Administered 2015-02-15: 600 mg via ORAL
  Filled 2015-02-15: qty 1

## 2015-02-15 MED ORDER — LITHIUM CARBONATE 300 MG PO CAPS
300.0000 mg | ORAL_CAPSULE | Freq: Two times a day (BID) | ORAL | Status: DC
  Administered 2015-02-15 – 2015-02-17 (×4): 300 mg via ORAL
  Filled 2015-02-15: qty 1
  Filled 2015-02-15: qty 10
  Filled 2015-02-15 (×3): qty 1
  Filled 2015-02-15: qty 10
  Filled 2015-02-15 (×4): qty 1

## 2015-02-15 MED ORDER — LOPERAMIDE HCL 2 MG PO CAPS: 2.0000 mg | ORAL_CAPSULE | ORAL | Status: DC | PRN

## 2015-02-15 MED ORDER — LORAZEPAM 1 MG PO TABS
1.0000 mg | ORAL_TABLET | Freq: Every day | ORAL | Status: DC
Start: 2015-02-15 — End: 2015-02-17
  Administered 2015-02-15 – 2015-02-16 (×2): 1 mg via ORAL
  Filled 2015-02-15 (×2): qty 1

## 2015-02-15 NOTE — Progress Notes (Signed)
Pt attended karaoke group tonight. 

## 2015-02-15 NOTE — [facility] Suicide Risk Assessment (Signed)
[facility] INPATIENT:  Family/Significant Other Suicide Prevention Education  Suicide Prevention Education:  Patient Refusal for Family/Significant Other Suicide Prevention Education: The patient Elizabeth Little has refused to provide written consent for family/significant other to be provided Family/Significant Other Suicide Prevention Education during admission and/or prior to discharge.  Physician notified.  [name], Radin Raptis Hairston 02/15/2015, 9:14 AM

## 2015-02-15 NOTE — Plan of Care (Signed)
Problem: Diagnosis: Increased Risk For Suicide Attempt Goal: STG-Patient Will Report Suicidal Feelings to Staff Outcome: Progressing Patient did state to writer that she was passive SI but verbally contracts for safety.

## 2015-02-15 NOTE — [facility] Group Notes (Signed)
[facility] Group Notes:  (Nursing/MHT/Case Management/Adjunct)  Date:  02/15/2015  Time:  0900am  Type of Therapy:  Nurse Education  Participation Level:  Did Not Attend  Participation Quality:  Did not attend  Affect:  Did not attend  Cognitive:  Did not attend  Insight:  None  Engagement in Group:  Did not attend  Modes of Intervention:  Discussion, Education and Support  Summary of Progress/Problems: Patient was invited to group. Pt did not attend and remained in bed resting.  Lendell CapriceGuthrie, Ajamu Maxon A 02/15/2015, 11:56 AM

## 2015-02-15 NOTE — Plan of Care (Signed)
Problem: Diagnosis: Increased Risk For Suicide Attempt Goal: STG-Patient Will Attend All Groups On The Unit Outcome: Progressing Patient is attending some unit groups today. Goal: STG-Patient Will Comply With Medication Regime Outcome: Progressing Patient has adhered to medication regimen today with ease.  Problem: Ineffective individual coping Goal: STG: Patient will remain free from self harm Outcome: Progressing Patient remains free from self harm. 15 minute checks continued per protocol for patient safety.   Problem: Alteration in mood Goal: LTG-Patient reports reduction in suicidal thoughts (Patient reports reduction in suicidal thoughts and is able to verbalize a safety plan for whenever patient is feeling suicidal)  Outcome: Progressing Patient denies having suicidal thoughts today.

## 2015-02-15 NOTE — Progress Notes (Signed)
D:Patient in the hallway on approach.  Patient states, "I had a pretty crappy day."  Patient states she did not feel well today.  Patient states she is on new medications and states she is waiting for her body to get used to them.  Patient denies SI/HI but states she is hearing voices.  Patient states the voices are telling her to harm herself but she states she contracts for safety.  Patient states the voices are quieting down some.   A: Staff to monitor Q 15 mins for safety.  Encouragement and support offered.  Scheduled medications administered per orders.   R: Patient remains safe on the unit.  Patient attended group tonight.  Patient visible on the unit and interacting with peers.  Patient taking administered medications.

## 2015-02-15 NOTE — Progress Notes (Signed)
D: Patient is alert and oriented. Pt's mood and affect is sad and depressed. Pt denies SI/HI and VH. Pt reports AH that say "You shouldn't be alive." Pt rates depression, hopelessness, and anxiety all 7/10. Pt reports her goal for the day is "focus on myself" and "stop worrying about what I can't control." Pt C/O nausea and abdominal cramping this morning. Pt is tachycardic this morning (See docflowsheet-vitals). 1 emesis occurrence today. Pt C/O diarrhea today. Pt is attending some unit groups today. A: Active listening by RN. Encouragement/Support provided to pt. Will reassess pulse. Medication education reviewed with pt. NP May made aware of pt's vomiting and diarrhea. PRN medication administered for pain and nausea. Scheduled medications administered per providers orders (See MAR). 15 minute checks continued per protocol for patient safety.  R: Pt verbally agrees not to act on any command hallucinations if they occur. Patient cooperative and receptive to nursing interventions. Pt remains safe.

## 2015-02-15 NOTE — Progress Notes (Signed)
[facility] MD Progress Note  02/15/2015 4:41 PM Elizabeth Little  MRN:  [id number] Subjective:   Patient states she is still " pretty depressed" although she admits there has been some improvement since admission. She also describes feeling anxious. Auditory hallucinations persist, and continue to be described as insulting or deprecating voices, but states they are getting " quieter". Denies medication side effects. Objective : I have discussed case with treatment team and have met with patient . She has been going to some groups and has been visible on the unit , and has had some limited socialization with select peers of around her age . Today presents depressed, sad, but denies any current SI, and reports some degree of improvement. She is still constricted in affect, but less tearful.  As noted, hallucinations are decreasing, at this time does not appear internally preoccupied . TSH WNL, Lipid panel essentially unremarkable except for slightly increased LDL.   Principal Problem: Overdose Diagnosis:   Patient Active Problem List   Diagnosis Date Noted  . Overdose [T50.901A] 02/13/2015  . Suicide attempt [T14.91] 02/11/2015  . Intentional drug overdose [T50.902A] 02/11/2015  . Drug overdose [T50.901A] 02/11/2015  . Adverse reaction to hormonal drug [T38.805A] 12/04/2014  . Complex regional pain syndrome of lower limb [G90.529] 12/04/2014  . Ovarian cyst [N83.20] 12/01/2014  . Anxiety [F41.9] 12/01/2014  . Bipolar I disorder, most recent episode depressed [F31.30]   . PTSD (post-traumatic stress disorder) [F43.10] 07/25/2014  . Bipolar disorder, current episode mixed, severe, with psychotic features [F31.64]   . Asthma [J45.909] 11/11/2012  . Dysmenorrhea [N94.6] 11/11/2012   Total Time spent with patient: 25 minutes    Past Medical History:  Past Medical History  Diagnosis Date  . Asthma   . RSD (reflex sympathetic dystrophy)   . Ovarian cyst   . Bipolar 1 disorder   . Vertigo   .  Anemia   . CRPS (complex regional pain syndrome)   . Anxiety   . Depression     Past Surgical History  Procedure Laterality Date  . Cholecystectomy     Family History:  Family History  Problem Relation Age of Onset  . Mental illness Mother   . Mental illness Father   . Mental illness Brother   . Asthma Brother    Social History:  History  Alcohol Use No    Comment: Rarely, Less than monthly     History  Drug Use  . Yes  . Special: Marijuana    History   Social History  . Marital Status: Married    Spouse Name: N/A  . Number of Children: N/A  . Years of Education: N/A   Social History Main Topics  . Smoking status: Former Research scientist (life sciences)  . Smokeless tobacco: Never Used  . Alcohol Use: No     Comment: Rarely, Less than monthly  . Drug Use: Yes    Special: Marijuana  . Sexual Activity: Yes    Birth Control/ Protection: None     Comment: intercourse age 59, sexual partners more than 5   Other Topics Concern  . None   Social History Narrative   Additional History:    Sleep: Fair  Appetite:  Fair   Assessment:   Musculoskeletal: Strength & Muscle Tone: within normal limits Gait & Station: normal Patient leans: N/A   Psychiatric Specialty Exam: Physical Exam  ROS- denies nausea, denies vomiting , denies headache today  Blood pressure 110/67, pulse 125, temperature 97.9 F (36.6 C), temperature source Oral,  resp. rate 16, height 5' 5.5" (1.664 m), weight 151 lb (68.493 kg), last menstrual period 01/09/2015.Body mass index is 24.74 kg/(m^2).  General Appearance: Fairly Groomed  Engineer, water::  Good  Speech:  Normal Rate  Volume:  Normal  Mood:  Depressed  Affect:  Constricted- some improvement compared to admission  Thought Process:  Linear  Orientation:  Full (Time, Place, and Person)  Thought Content:  (+) auditory hallucinations, some improvement compared to admission, not internally preoccupied at this time  Suicidal Thoughts:  Yes.  without  intent/plan- (+) passive thoughts of death, dying, but denies plan or intention of hurting/killing self at this time  Homicidal Thoughts:  No  Memory:  recent and remote grossly intact   Judgement:  Fair  Insight:  Fair  Psychomotor Activity:  Normal  Concentration:  Good  Recall:  Good  Fund of Knowledge:Good  Language: Good  Akathisia:  Negative  Handed:  Right  AIMS (if indicated):     Assets:  Communication Skills Desire for Improvement Physical Health Resilience  ADL's: fair   Cognition: WNL  Sleep:        Current Medications: Current Facility-Administered Medications  Medication Dose Route Frequency Provider Last Rate Last Dose  . alum & mag hydroxide-simeth (MAALOX/MYLANTA) 200-200-20 MG/5ML suspension 30 mL  30 mL Oral Q4H PRN Benjamine Mola, FNP      . ibuprofen (ADVIL,MOTRIN) tablet 600 mg  600 mg Oral Q6H PRN Jenne Campus, MD   600 mg at 02/15/15 1135  . lithium carbonate capsule 150 mg  150 mg Oral BID WC Jenne Campus, MD   150 mg at 02/15/15 0848  . loperamide (IMODIUM) capsule 2 mg  2 mg Oral PRN Kerrie Buffalo, NP      . LORazepam (ATIVAN) tablet 0.5 mg  0.5 mg Oral Q6H PRN Jenne Campus, MD   0.5 mg at 02/14/15 1658  . LORazepam (ATIVAN) tablet 1 mg  1 mg Oral QHS Idrees Quam A Cory Kitt, MD      . lurasidone (LATUDA) tablet 20 mg  20 mg Oral Q breakfast Jenne Campus, MD   20 mg at 02/15/15 0848  . magnesium hydroxide (MILK OF MAGNESIA) suspension 30 mL  30 mL Oral Daily PRN Benjamine Mola, FNP      . ondansetron (ZOFRAN-ODT) disintegrating tablet 4 mg  4 mg Oral Q8H PRN Laverle Hobby, PA-C   4 mg at 02/15/15 1136    Lab Results:  Results for orders placed or performed during the hospital encounter of 02/13/15 (from the past 48 hour(s))  TSH     Status: None   Collection Time: 02/15/15  6:45 AM  Result Value Ref Range   TSH 1.181 0.350 - 4.500 uIU/mL    Comment: Performed at [facility]  Lipid panel     Status: Abnormal    Collection Time: 02/15/15  6:45 AM  Result Value Ref Range   Cholesterol 188 0 - 200 mg/dL   Triglycerides 81 <150 mg/dL   HDL 58 >40 mg/dL   Total CHOL/HDL Ratio 3.2 RATIO   VLDL 16 0 - 40 mg/dL   LDL Cholesterol 114 (H) 0 - 99 mg/dL    Comment:        Total Cholesterol/HDL:CHD Risk Coronary Heart Disease Risk Table                     Men   Women  1/2 Average Risk   3.4  3.3  Average Risk       5.0   4.4  2 X Average Risk   9.6   7.1  3 X Average Risk  23.4   11.0        Use the calculated Patient Ratio above and the CHD Risk Table to determine the patient's CHD Risk.        ATP III CLASSIFICATION (LDL):  <100     mg/dL   Optimal  100-129  mg/dL   Near or Above                    Optimal  130-159  mg/dL   Borderline  160-189  mg/dL   High  >190     mg/dL   Very High Performed at [facility]     Physical Findings: AIMS: Facial and Oral Movements Muscles of Facial Expression: None, normal Lips and Perioral Area: None, normal Jaw: None, normal Tongue: None, normal,Extremity Movements Upper (arms, wrists, hands, fingers): None, normal Lower (legs, knees, ankles, toes): None, normal, Trunk Movements Neck, shoulders, hips: None, normal, Overall Severity Severity of abnormal movements (highest score from questions above): None, normal Incapacitation due to abnormal movements: None, normal Patient's awareness of abnormal movements (rate only patient's report): No Awareness, Dental Status Current problems with teeth and/or dentures?: No Does patient usually wear dentures?: No  CIWA:    COWS:      Assessment- patient remains significantly depressed, sad, and has some passive SI, but is slightly improved compared to admission- less tearful, slightly more hopeful she will continue to improve. Auditory hallucinations are improving/decreasing in intensity. Anxiety symptoms persist. Thus far tolerating medications well .   Treatment Plan Summary: Daily contact with  patient to assess and evaluate symptoms and progress in treatment, Medication management, Plan continue inpatient psychiatric treatment and medicaitons as below Increase Lithium to 300 mgrs BID for mood disorder Increase Latuda to 40 mgrs QDAY for mood disorder and psychosis Start Ativan 1 mgr QHS for Insomnia  Continue Ativan PRN anxiety Continue Zofran PRN nausea  Medical Decision Making:  Established Problem, Stable/Improving (1), Review of Psycho-Social Stressors (1), Review or order clinical lab tests (1) and Review of New Medication or Change in Dosage (2)     Buren Havey 02/15/2015, 4:41 PM

## 2015-02-16 LAB — HEMOGLOBIN A1C
HEMOGLOBIN A1C: 5.4 % (ref 4.8–5.6)
Mean Plasma Glucose: 108 mg/dL

## 2015-02-16 NOTE — Progress Notes (Signed)
Recreation Therapy Notes  Date: 07.01.16 Time: 9:30 am Location: [address] Group Room  Group Topic: Stress Management  Goal Area(s) Addresses:  Patient will verbalize importance of using healthy stress management.  Patient will identify positive emotions associated with healthy stress management.   Intervention: Stress Management  Activity :  Guided Training and development officermagery Script.  LRT introduced and educated patients on stress management technique of guided imagery.  A script was used to deliver the technique to patients.  Patients were asked to follow script read aloud by LRT to engage in practicing the stress management technique.  Education:  Stress Management, Discharge Planning.   Education Outcome: Acknowledges edcuation/In group clarification offered/Needs additional education  Clinical Observations/Feedback: Patient did not attend group.   Caroll RancherMarjette Tyrica Afzal, LRT/CTRS         Lillia AbedLindsay, Mirriam Vadala A 02/16/2015 1:35 PM

## 2015-02-16 NOTE — Progress Notes (Signed)
D: Pt presents with flat affect and depressed mood. Pt rates depression 3/10. Anxiety 3/10. Hopeless 3/10. Pt denies suicidal/homicidal thoughts today. Pt irritable on approach d/t lack of sleep last night. Pt minimal this morning and had little engagement with other pts on the milieu. Pt appears to be minimizing symptoms today. Pt appears sad as if something is troubling her but remains cautious during assessment.  A: Medications administered as ordered per MD.  Verbal support given. Pt encouraged to attend group. 15 minute checks performed for safety.  R: Pt stated goal is to go home. Pt compliant with tx.

## 2015-02-16 NOTE — [facility] Group Notes (Signed)
[facility] LCSW Group Therapy 02/16/2015 1:15 PM Type of Therapy: Group Therapy Participation Level: Active  Participation Quality: Attentive  Affect: Depressed and Flat  Cognitive: Alert and Oriented  Insight: Developing/Improving and Engaged  Engagement in Therapy: Developing/Improving and Engaged  Modes of Intervention: Clarification, Confrontation, Discussion, Education, Exploration, Limit-setting, Orientation, Problem-solving, Rapport Building, Dance movement psychotherapisteality Testing, Socialization and Support  Summary of Progress/Problems: The topic for today was feelings about relapse. Pt discussed what relapse prevention is to them and identified triggers that they are on the path to relapse. Pt processed their feeling towards relapse and was able to relate to peers. Pt discussed coping skills that can be used for relapse prevention. Patient observed actively listening during conversation but did not participate despite CSW encouragement.   Samuella BruinKristin Kenya Shiraishi, MSW, [employer]CSWA Clinical Social Worker [facility] [phone]

## 2015-02-16 NOTE — Progress Notes (Signed)
D: Pt denies SI/HI/AV. Pt is pleasant and cooperative. Pt stated she was ready to go to spend time with her cats.   A: Pt was offered support and encouragement. Pt was given scheduled medications. Pt was encourage to attend groups. Q 15 minute checks were done for safety.    R:Pt attends groups and interacts well with peers and staff. Pt is taking medication. Pt has no complaints at this time .Pt receptive to treatment and safety maintained on unit.

## 2015-02-16 NOTE — Plan of Care (Signed)
Problem: Diagnosis: Increased Risk For Suicide Attempt Goal: LTG-Patient Will Show Positive Response to Medication LTG (by discharge) : Patient will show positive response to medication and will participate in the development of the discharge plan.  Outcome: Progressing Pt stated medication making the voices go away Goal: LTG-Patient Will Report Improvement in Psychotic Symptoms LTG (by discharge) : Patient will report improvement in psychotic symptoms.  Outcome: Progressing Pt stated the voices were not there today Goal: STG-Patient Will Comply With Medication Regime Outcome: Progressing Pt stated she will continue to take her medication when she leaves  Problem: Ineffective individual coping Goal: LTG: Patient will report a decrease in negative feelings Outcome: Progressing Pt stated she felt a little better today

## 2015-02-16 NOTE — Tx Team (Addendum)
Interdisciplinary Treatment Plan Update (Adult)  Date:  02/16/2015  Time Reviewed:  9:52 AM   Progress in Treatment: Attending groups: Patient is attending groups. Participating in groups:  Patient engages in discussion Taking medication as prescribed:  Patient is taking medications Tolerating medication:  Patient is tolerating medications Family/Significant othe contact made:   No, patient declined collateral contact Patient understands diagnosis:Yes, patient understands diagnosis and need for treatment Discussing patient identified problems/goals with staff:  Yes, patient is able to express goals/problems Medical problems stabilized or resolved:  Yes Denies suicidal/homicidal ideation: Yes, patient is denying SI/HI. Issues/concerns per patient self-inventory:   Other:  Discharge Plan or Barriers:  Home with outpatient follow [facility]   Reason for Continuation of Hospitalization: Anxiety Depression Medication stabilization Suicidal ideation  Comments:  Patient signed 72 hour request for discharge on 02/14/15.  She will need to discharge on 02/17/15 or determination for involuntary commitment   Additional comments:  Patient and CSW reviewed Patient Discharge Process Letter/Patient Involvement Form.  Patient verbalized understanding and signed form.  Patient and CSW also reviewed and identified patient's goals and treatment plan.  Patient verbalized understanding and agreed to plan.  Estimated length of stay: 1-2 days New goal(s):  Review of initial/current patient goals per problem list:  Please see plan of careInterdisciplinary Treatment Plan Update (Adult)  Attendees: Patient 02/16/2015 9:52 AM   Family:   02/16/2015 9:52 AM   Physician:  Nehemiah MassedFernando Cobos, MD 02/16/2015 9:52 AM   Nursing:   Dennison Nancyonna Perez, RN 02/16/2015 9:52 AM   Clinical Social Worker:  Juline PatchQuylle Onna Nodal, LCSW 02/16/2015 9:52 AM   Clinical Social Worker:  Belenda CruiseKristin Drinkard, LCSW-A 02/16/2015 9:52 AM   Case  Manager:  Onnie BoerJennifer Clark, RN 02/16/2015 9:52 AM   Other:  Quintella ReichertBeverly Knight, RN 02/16/2015 9:52 AM  Other:   02/16/2015  9:52 AM   Other:  02/16/2015 9:52 AM   Other:  02/16/2015 9:52 AM   Other:  02/16/2015 9:52 AM   Other:  Chad CordialValerie Enoch, [facility] Transition Team Coordinator 02/16/2015 9:52 AM   Other:   02/16/2015 9:52 AM   Other:  02/16/2015 9:52 AM   Other:   02/16/2015 9:52 AM    Scribe for Treatment Team:   [name], Spike Desilets Hairston, 02/16/2015   9:52 AM

## 2015-02-16 NOTE — [facility] Group Notes (Signed)
[facility]   02/16/2015 10:01 AM  Participation Quality:  Patient did not attend group.  Elizabeth Little, Elizabeth Little

## 2015-02-16 NOTE — Progress Notes (Signed)
Adult Psychoeducational Group Note  Date:  02/16/2015 Time:  8:55 PM  Group Topic/Focus:  Wrap-Up Group:   The focus of this group is to help patients review their daily goal of treatment and discuss progress on daily workbooks.  Participation Level:  Active  Participation Quality:  Appropriate  Affect:  Appropriate  Cognitive:  Alert  Insight: Appropriate  Engagement in Group:  Engaged  Modes of Intervention:  Discussion  Additional Comments:  Pt stated that she had a rollercoaster of a day, but feels better as the day has gone on. She has found that groups to be helpful for her.   Kaleen OdeaCOOKE, Joenathan Sakuma R 02/16/2015, 8:55 PM

## 2015-02-16 NOTE — Progress Notes (Signed)
Patient ID: Elizabeth Little, female   DOB: April 10, 1991, 24 y.o.   MRN: [id number] [facility] MD Progress Note  02/16/2015 3:29 PM Elizabeth Little  MRN:  [id number] Subjective:   Patient states she is  Feeling " a little better".  She states auditory hallucinations have resolved and that she has not heard any voices today. Denies medication side effects. Today patient is focused on being discharged soon- she states she needs to return to work soon and wants to return to her boyfriend and regular life activities . Objective : I have discussed case with treatment team and have met with patient . I have also met with patient along with CSW- as noted, patient currently focused on being discharged soon and as per nursing staff has turned in a " 72 hour letter". We discussed this with her - patient states she is feeling a little better and feels she needs to return to work and to her daily activities soon. She denies medication side effects and feels these medications are helping . With patient's express consent, i spoke with her father on the phone- father reported family has been concerned because patient has been apparently been spending hundreds of dollars but there are no visible purchases or receipts . Father does not endorse any behaviors suggestive of drug intoxication, and patient categorically denies any drug abuse ( other than occasional cannabis use ) . UDS was positive of cannabis . Although reporting ongoing improvement and feeling better today, patient does continue to present with intermittent tearfulness, sadness, and an overall constricted affect. She has been going to groups, behavior on unit is calm, no disruptive or self injurious behaviors. She denies any suicidal ideations, denies any psychotic symptoms. .   Principal Problem: Overdose Diagnosis:   Patient Active Problem List   Diagnosis Date Noted  . Overdose [T50.901A] 02/13/2015  . Suicide attempt [T14.91] 02/11/2015  . Intentional drug  overdose [T50.902A] 02/11/2015  . Drug overdose [T50.901A] 02/11/2015  . Adverse reaction to hormonal drug [T38.805A] 12/04/2014  . Complex regional pain syndrome of lower limb [G90.529] 12/04/2014  . Ovarian cyst [N83.20] 12/01/2014  . Anxiety [F41.9] 12/01/2014  . Bipolar I disorder, most recent episode depressed [F31.30]   . PTSD (post-traumatic stress disorder) [F43.10] 07/25/2014  . Bipolar disorder, current episode mixed, severe, with psychotic features [F31.64]   . Asthma [J45.909] 11/11/2012  . Dysmenorrhea [N94.6] 11/11/2012   Total Time spent with patient: 30 minutes - more than 50 % of time spent on counseling and disposition planning   Past Medical History:  Past Medical History  Diagnosis Date  . Asthma   . RSD (reflex sympathetic dystrophy)   . Ovarian cyst   . Bipolar 1 disorder   . Vertigo   . Anemia   . CRPS (complex regional pain syndrome)   . Anxiety   . Depression     Past Surgical History  Procedure Laterality Date  . Cholecystectomy     Family History:  Family History  Problem Relation Age of Onset  . Mental illness Mother   . Mental illness Father   . Mental illness Brother   . Asthma Brother    Social History:  History  Alcohol Use No    Comment: Rarely, Less than monthly     History  Drug Use  . Yes  . Special: Marijuana    History   Social History  . Marital Status: Married    Spouse Name: N/A  . Number of Children: N/A  .  Years of Education: N/A   Social History Main Topics  . Smoking status: Former Research scientist (life sciences)  . Smokeless tobacco: Never Used  . Alcohol Use: No     Comment: Rarely, Less than monthly  . Drug Use: Yes    Special: Marijuana  . Sexual Activity: Yes    Birth Control/ Protection: None     Comment: intercourse age 10, sexual partners more than 5   Other Topics Concern  . None   Social History Narrative   Additional History:    Sleep: Fair  Appetite:  Fair   Assessment:   Musculoskeletal: Strength &  Muscle Tone: within normal limits Gait & Station: normal Patient leans: N/A   Psychiatric Specialty Exam: Physical Exam  ROS- denies nausea, denies vomiting , denies headache today  Blood pressure 110/67, pulse 125, temperature 97.9 F (36.6 C), temperature source Oral, resp. rate 16, height 5' 5.5" (1.664 m), weight 151 lb (68.493 kg), last menstrual period 01/09/2015.Body mass index is 24.74 kg/(m^2).  General Appearance: improved grooming   Eye Contact::  Good  Speech:  Normal Rate  Volume:  Normal  Mood:  Depressed- but states she feels better   Affect:  Constricted-  Remains intermittently tearful  Thought Process:  Linear  Orientation:  Full (Time, Place, and Person)  Thought Content:  Denies any ongoing auditory hallucinations and at this time does not present with any internal preoccupation- no delusions expressed   Suicidal Thoughts:  No- currently denies any thoughts of hurting self or any SI thoughts   Homicidal Thoughts:  No  Memory:  recent and remote grossly intact   Judgement:  Fair  Insight:  Fair  Psychomotor Activity:  Normal  Concentration:  Good  Recall:  Good  Fund of Knowledge:Good  Language: Good  Akathisia:  Negative  Handed:  Right  AIMS (if indicated):     Assets:  Communication Skills Desire for Improvement Physical Health Resilience  ADL's: fair   Cognition: WNL  Sleep:  Number of Hours: 6.5     Current Medications: Current Facility-Administered Medications  Medication Dose Route Frequency Provider Last Rate Last Dose  . alum & mag hydroxide-simeth (MAALOX/MYLANTA) 200-200-20 MG/5ML suspension 30 mL  30 mL Oral Q4H PRN Benjamine Mola, FNP      . ibuprofen (ADVIL,MOTRIN) tablet 600 mg  600 mg Oral Q6H PRN Jenne Campus, MD   600 mg at 02/15/15 1135  . lithium carbonate capsule 300 mg  300 mg Oral BID WC Jenne Campus, MD   300 mg at 02/16/15 0816  . loperamide (IMODIUM) capsule 2 mg  2 mg Oral PRN Kerrie Buffalo, NP      . LORazepam  (ATIVAN) tablet 0.5 mg  0.5 mg Oral Q6H PRN Jenne Campus, MD   0.5 mg at 02/14/15 1658  . LORazepam (ATIVAN) tablet 1 mg  1 mg Oral QHS Myer Peer Tyger Oka, MD   1 mg at 02/15/15 2212  . lurasidone (LATUDA) tablet 40 mg  40 mg Oral Q breakfast Jenne Campus, MD   40 mg at 02/16/15 0815  . magnesium hydroxide (MILK OF MAGNESIA) suspension 30 mL  30 mL Oral Daily PRN Benjamine Mola, FNP      . ondansetron (ZOFRAN-ODT) disintegrating tablet 4 mg  4 mg Oral Q8H PRN Laverle Hobby, PA-C   4 mg at 02/16/15 0998    Lab Results:  Results for orders placed or performed during the hospital encounter of 02/13/15 (from the past 48  hour(s))  TSH     Status: None   Collection Time: 02/15/15  6:45 AM  Result Value Ref Range   TSH 1.181 0.350 - 4.500 uIU/mL    Comment: Performed at [facility]  Lipid panel     Status: Abnormal   Collection Time: 02/15/15  6:45 AM  Result Value Ref Range   Cholesterol 188 0 - 200 mg/dL   Triglycerides 81 <150 mg/dL   HDL 58 >40 mg/dL   Total CHOL/HDL Ratio 3.2 RATIO   VLDL 16 0 - 40 mg/dL   LDL Cholesterol 114 (H) 0 - 99 mg/dL    Comment:        Total Cholesterol/HDL:CHD Risk Coronary Heart Disease Risk Table                     Men   Women  1/2 Average Risk   3.4   3.3  Average Risk       5.0   4.4  2 X Average Risk   9.6   7.1  3 X Average Risk  23.4   11.0        Use the calculated Patient Ratio above and the CHD Risk Table to determine the patient's CHD Risk.        ATP III CLASSIFICATION (LDL):  <100     mg/dL   Optimal  100-129  mg/dL   Near or Above                    Optimal  130-159  mg/dL   Borderline  160-189  mg/dL   High  >190     mg/dL   Very High Performed at [facility]   Hemoglobin A1c     Status: None   Collection Time: 02/15/15  6:45 AM  Result Value Ref Range   Hgb A1c MFr Bld 5.4 4.8 - 5.6 %    Comment: (NOTE)         Pre-diabetes: 5.7 - 6.4         Diabetes: >6.4         Glycemic control for  adults with diabetes: <7.0    Mean Plasma Glucose 108 mg/dL    Comment: (NOTE) Performed At: [facility] [place], [place] [id number] Lindon Romp MD YS:[id number] Performed at [facility]     Physical Findings: AIMS: Facial and Oral Movements Muscles of Facial Expression: None, normal Lips and Perioral Area: None, normal Jaw: None, normal Tongue: None, normal,Extremity Movements Upper (arms, wrists, hands, fingers): None, normal Lower (legs, knees, ankles, toes): None, normal, Trunk Movements Neck, shoulders, hips: None, normal, Overall Severity Severity of abnormal movements (highest score from questions above): None, normal Incapacitation due to abnormal movements: None, normal Patient's awareness of abnormal movements (rate only patient's report): No Awareness, Dental Status Current problems with teeth and/or dentures?: No Does patient usually wear dentures?: No  CIWA:    COWS:      Assessment- patient remains depressed and intermittently tearful, with constricted affect. She states, however, that she feels better and at this time is focused on being discharged soon. She has submitted a letter requesting discharge. She is not suicidal and has not presented with any self injurious ideations . Psychotic symptoms, which had been very disturbing and disruptive for her, are now resolved . Family expresses concern about patient spending money without any items or receipts to trace her purchases- patient has denied  any drug use other than cannabis . Tolerating medications well .   Treatment Plan Summary: Daily contact with patient to assess and evaluate symptoms and progress in treatment, Medication management, Plan continue inpatient psychiatric treatment and medicaitons as below Continue  Lithium  300 mgrs BID for mood disorder Continue  Latuda   40 mgrs QDAY for mood disorder and psychosis Continue Ativan 1 mgr QHS for Insomnia   Continue Ativan PRN anxiety Continue Zofran PRN nausea Obtain Lithium serum level in AM I have reviewed case with Dr. Sabra Heck , who will be following her tomorrow, and determining  Appropriate discharge date/plan Patient is expressing interest in [place] participation after discharge and appointment has been made for her to see [facility] - Tuesday 7/5 at 8,30 AM - patient aware    Medical Decision Making:  Established Problem, Stable/Improving (1), Review of Psycho-Social Stressors (1), Review or order clinical lab tests (1) and Review of New Medication or Change in Dosage (2)     Neill Jurewicz 02/16/2015, 3:29 PM

## 2015-02-16 NOTE — [facility] Suicide Risk Assessment (Signed)
[facility] INPATIENT:  Family/Significant Other Suicide Prevention Education  Suicide Prevention Education:  Education Completed; Elizabeth Little, [phone];  has been identified by the patient as the family member/significant other with whom the patient will be residing, and identified as the person(s) who will aid the patient in the event of a mental health crisis (suicidal ideations/suicide attempt).  With written consent from the patient, the family member/significant other has been provided the following suicide prevention education, prior to the and/or following the discharge of the patient.  The suicide prevention education provided includes the following:  Suicide risk factors  Suicide prevention and interventions  National Suicide Hotline telephone number  [facility] assessment telephone number  [facility] Emergency Assistance 911  [facility] and/or Residential Mobile Crisis Unit telephone number  Request made of family/significant other to:  Remove weapons (e.g., guns, rifles, knives), all items previously/currently identified as safety concern.   Father advised patient does not have access to weapons.    Remove drugs/medications (over-the-counter, prescriptions, illicit drugs), all items previously/currently identified as a safety concern.  The family member/significant other verbalizes understanding of the suicide prevention education information provided.  The family member/significant other agrees to remove the items of safety concern listed above.  Elizabeth Little, Elizabeth Little 02/16/2015, 1:27 PM

## 2015-02-17 DIAGNOSIS — X58XXXA Exposure to other specified factors, initial encounter: Secondary | ICD-10-CM

## 2015-02-17 LAB — LITHIUM LEVEL: Lithium Lvl: 0.41 mmol/L — ABNORMAL LOW (ref 0.60–1.20)

## 2015-02-17 MED ORDER — NORGESTIMATE-ETH ESTRADIOL 0.25-35 MG-MCG PO TABS: 1.0000 | ORAL_TABLET | Freq: Every day | ORAL | Status: DC

## 2015-02-17 MED ORDER — ONDANSETRON 4 MG PO TBDP: 4.0000 mg | ORAL_TABLET | Freq: Three times a day (TID) | ORAL | Status: AC | PRN

## 2015-02-17 MED ORDER — LITHIUM CARBONATE 300 MG PO CAPS: 300.0000 mg | ORAL_CAPSULE | Freq: Two times a day (BID) | ORAL | Status: AC

## 2015-02-17 MED ORDER — LORAZEPAM 1 MG PO TABS: 1.0000 mg | ORAL_TABLET | Freq: Every day | ORAL | Status: AC

## 2015-02-17 MED ORDER — LURASIDONE HCL 40 MG PO TABS: 40.0000 mg | ORAL_TABLET | Freq: Every day | ORAL | Status: AC

## 2015-02-17 NOTE — [facility] Group Notes (Signed)
The focus of this group is to educate the patient on the purpose and policies of crisis stabilization and provide a format to answer questions about their admission.  The group details unit policies and expectations of patients while admitted.  Patient did not attend 0900 nurse education orientation group this morning.  Patient stayed in bed.   

## 2015-02-17 NOTE — Progress Notes (Signed)
  [facility] :  Will you be returning to the same living situation after discharge:  Yes At discharge, do you have transportation home?: Yes Do you have the ability to pay for your medications: Yes  Release of information consent forms completed and in the chart;  Patient's signature needed at discharge.  Patient to Follow up at: Follow-up Information    [facility]   [facility] On 03/[facility].   Why:  You are scheduled with Dr. Jannifer FranklinAkintayo on Monday, March 26, 2015 at 12:30 PM   Contact information:   [address] [place], [place]   [id number]  [phone]      Follow up with Geryl CouncilmanEugene Noughton [facility] On 02/27/2015.   Why:  Tuesday, February 27, 2015 at [facility]   Contact information:   [address] [place], [place]   [id number]  [phone]      Safety Planning and Suicide Prevention discussed: Yes  Have you used any form of tobacco in the last 30 days? (Cigarettes, Smokeless Tobacco, Cigars, and/or Pipes): No  Has patient been referred to the Quitline?: N/A patient is not a smoker  Clide DalesHarrill, Catherine Campbell 02/17/2015, 12:26 PM

## 2015-02-17 NOTE — Discharge Summary (Signed)
Physician Discharge Summary Note  Patient:  Elizabeth Little is an 24 y.o., female MRN:  [id number] DOBZarie Little 08, 1992 Patient phone:  [phone] (home)  Patient address:   [address] [place] [address],  Total Time spent with patient: 30 minutes  Date of Admission:  02/13/2015 Date of Discharge: 02/17/15  Reason for Admission:  Mood stabilization Elizabeth Little is a 24 y.o. female patient admitted with intentional drug overdose on Ambien, Benadryl and Ibuprofen. States she did not want to live. She is known to [facility], last admitted 12/15. Patient told the past few months she's been experiencing increased mania and severe depression. Her triggers include being unhappy with her psychiatrist Dr. Tomasa Rand and having her medicine changed multiple times in past 3 months. She endorsed last week she has severe mania when she was spending too much money and very impulsive and aggressive and now she is crashing into severe depression. She is taking Risperdal, Lamictal and Lexapro does not believe her medicine were effective. She is working full-time at [employer] but does not like her job. Patient has history of cutting herself and she remember last time she cut herself in December 2015. She admitted history of drinking and smoking marijuana but she has not done in recent months.  Discharge Diagnoses:  Principal Problem:   Overdose  Psychiatric Specialty Exam: Physical Exam  Psychiatric: She has a normal mood and affect. Her speech is normal and behavior is normal. Judgment and thought content normal. Cognition and memory are normal.    Review of Systems  Constitutional: Negative.   HENT: Negative.   Eyes: Negative.   Respiratory: Negative.   Cardiovascular: Negative.   Gastrointestinal: Negative.   Genitourinary: Negative.   Musculoskeletal: Negative.   Skin: Rash: Present prior to admission, has been improving with Kenalog cream.  Neurological: Negative.    Endo/Heme/Allergies: Negative.   Psychiatric/Behavioral: Positive for depression (Stabilized with medications). Negative for suicidal ideas (Stabilized with medications ).  All other systems reviewed and are negative.   Blood pressure 109/70, pulse 93, temperature 97.7 F (36.5 C), temperature source Oral, resp. rate 16, height 5' 5.5" (1.664 m), weight 68.493 kg (151 lb), last menstrual period 01/09/2015.Body mass index is 24.74 kg/(m^2).  See Physician SRA     Musculoskeletal: Strength & Muscle Tone: within normal limits Gait & Station: normal Patient leans: N/A  DSM5: Principal Diagnosis: Overdose  Problem list: Patient Active Problem List   Diagnosis Date Noted  . Overdose 02/13/2015    Priority: High  . Suicide attempt 02/11/2015  . Intentional drug overdose 02/11/2015  . Drug overdose 02/11/2015  . Adverse reaction to hormonal drug 12/04/2014  . Complex regional pain syndrome of lower limb 12/04/2014  . Ovarian cyst 12/01/2014  . Anxiety 12/01/2014  . Bipolar I disorder, most recent episode depressed   . PTSD (post-traumatic stress disorder) 07/25/2014  . Bipolar disorder, current episode mixed, severe, with psychotic features   . Asthma 11/11/2012  . Dysmenorrhea 11/11/2012   Hospital Course:   Elizabeth Little was admitted for Overdose , with crisis management.  Pt was treated discharged with the medications listed below under Medication List.  Medical problems were identified and treated as needed.  Home medications were restarted as appropriate.  Improvement was monitored by observation and Elizabeth Little 's daily report of symptom reduction.  Emotional and mental status was monitored by daily self-inventory reports completed by Elizabeth Little and clinical staff.         Elizabeth Little  was evaluated by the treatment team for stability and plans for continued recovery upon discharge. Elizabeth Little 's motivation was an integral factor for scheduling further treatment.  Employment, transportation, bed availability, health status, family support, and any pending legal issues were also considered during hospital stay. Pt was offered further treatment options upon discharge including but not limited to Residential, Intensive Outpatient, and Outpatient treatment.  Elizabeth Little will follow up with the services as listed below under Follow Up Information.     Upon completion of this admission the patient was both mentally and medically stable for discharge denying suicidal/homicidal ideation, auditory/visual/tactile hallucinations, delusional thoughts and paranoia.   Consults:  psychiatry  Significant Diagnostic Studies:  LDL 114 (H)  Discharge Vitals:   Blood pressure 109/70, pulse 93, temperature 97.7 F (36.5 C), temperature source Oral, resp. rate 16, height 5' 5.5" (1.664 m), weight 68.493 kg (151 lb), last menstrual period 01/09/2015. Body mass index is 24.74 kg/(m^2). Lab Results:   Results for orders placed or performed during the hospital encounter of 02/13/15 (from the past 72 hour(s))  TSH     Status: None   Collection Time: 02/15/15  6:45 AM  Result Value Ref Range   TSH 1.181 0.350 - 4.500 uIU/mL    Comment: Performed at [facility]  Lipid panel     Status: Abnormal   Collection Time: 02/15/15  6:45 AM  Result Value Ref Range   Cholesterol 188 0 - 200 mg/dL   Triglycerides 81 <409 mg/dL   HDL 58 >81 mg/dL   Total CHOL/HDL Ratio 3.2 RATIO   VLDL 16 0 - 40 mg/dL   LDL Cholesterol 191 (H) 0 - 99 mg/dL    Comment:        Total Cholesterol/HDL:CHD Risk Coronary Heart Disease Risk Table                     Men   Women  1/2 Average Risk   3.4   3.3  Average Risk       5.0   4.4  2 X Average Risk   9.6   7.1  3 X Average Risk  23.4   11.0        Use the calculated Patient Ratio above and the CHD Risk Table to determine the patient's CHD Risk.        ATP III CLASSIFICATION (LDL):  <100     mg/dL   Optimal  478-295  mg/dL    Near or Above                    Optimal  130-159  mg/dL   Borderline  621-308  mg/dL   High  >657     mg/dL   Very High Performed at [facility]   Hemoglobin A1c     Status: None   Collection Time: 02/15/15  6:45 AM  Result Value Ref Range   Hgb A1c MFr Bld 5.4 4.8 - 5.6 %    Comment: (NOTE)         Pre-diabetes: 5.7 - 6.4         Diabetes: >6.4         Glycemic control for adults with diabetes: <7.0    Mean Plasma Glucose 108 mg/dL    Comment: (NOTE) Performed At: [facility] [address] [place], [place] [id number] Mila Homer MD WU:[id number] Performed at [facility]   Lithium level  Status: Abnormal   Collection Time: 02/17/15  6:30 AM  Result Value Ref Range   Lithium Lvl 0.41 (L) 0.60 - 1.20 mmol/L    Comment: [facility] at [facility]    Physical Findings: AIMS: Facial and Oral Movements Muscles of Facial Expression: None, normal Lips and Perioral Area: None, normal Jaw: None, normal Tongue: None, normal,Extremity Movements Upper (arms, wrists, hands, fingers): None, normal Lower (legs, knees, ankles, toes): None, normal, Trunk Movements Neck, shoulders, hips: None, normal, Overall Severity Severity of abnormal movements (highest score from questions above): None, normal Incapacitation due to abnormal movements: None, normal Patient's awareness of abnormal movements (rate only patient's report): No Awareness, Dental Status Current problems with teeth and/or dentures?: No Does patient usually wear dentures?: No  CIWA:    COWS:     Psychiatric Specialty Exam: See Psychiatric Specialty Exam and Suicide Risk Assessment completed by Attending Physician prior to discharge.  Discharge destination:  Home  Is patient on multiple antipsychotic therapies at discharge:  No   Has Patient had three or more failed trials of antipsychotic monotherapy by history:  No  Recommended Plan for Multiple  Antipsychotic Therapies: NA     Medication List    STOP taking these medications        docusate sodium 100 MG capsule  Commonly known as:  COLACE     escitalopram 20 MG tablet  Commonly known as:  LEXAPRO     hydrOXYzine 25 MG tablet  Commonly known as:  ATARAX/VISTARIL     ibuprofen 600 MG tablet  Commonly known as:  ADVIL,MOTRIN     lactulose 10 GM/15ML solution  Commonly known as:  CHRONULAC     lamoTRIgine 200 MG tablet  Commonly known as:  LAMICTAL     risperiDONE 2 MG tablet  Commonly known as:  RISPERDAL      TAKE these medications      Indication   albuterol 108 (90 BASE) MCG/ACT inhaler  Commonly known as:  PROVENTIL HFA;VENTOLIN HFA  Inhale 2 puffs into the lungs every 6 (six) hours as needed for wheezing.      bisacodyl 10 MG suppository  Commonly known as:  BISACODYL LAXATIVE  Place 1 suppository (10 mg total) rectally as needed for moderate constipation.      lithium carbonate 300 MG capsule  Take 1 capsule (300 mg total) by mouth 2 (two) times daily with a meal.   Indication:  mood stabilization     LORazepam 1 MG tablet  Commonly known as:  ATIVAN  Take 1 tablet (1 mg total) by mouth at bedtime.   Indication:  Trouble Sleeping due to Feeling Anxious     lurasidone 40 MG Tabs tablet  Commonly known as:  LATUDA  Take 1 tablet (40 mg total) by mouth daily with breakfast.   Indication:  mood stabilization     norgestimate-ethinyl estradiol 0.25-35 MG-MCG tablet  Commonly known as:  ORTHO-CYCLEN,SPRINTEC,PREVIFEM  Take 1 tablet by mouth daily.   Indication:  Female Hypogonadism, hormonal regulation managed by PCP     ondansetron 4 MG disintegrating tablet  Commonly known as:  ZOFRAN-ODT  Take 1 tablet (4 mg total) by mouth every 8 (eight) hours as needed for nausea or vomiting.   Indication:  nausea       Follow-up Information    [facility]  [facility]   [facility] On 03/26/[facility].   Why:  You are scheduled with Dr. Jannifer FranklinAkintayo  on Monday, March 26, 2015 at 12:30 PM   Contact information:   [address] [place], [place]   [address]  [phone]      Follow up with Geryl Councilman [facility] On 02/27/2015.   Why:  Tuesday, February 27, 2015 at [facility] information:   [address] [place], [place]   [address]  [phone]      Follow-up recommendations:   Activity: as tolerated Diet: regular Follow up outpatient basis  Comments:    Take all medications as prescribed. Keep all follow-up appointments as scheduled.  Do not consume alcohol or use illegal drugs while on prescription medications.  Report any adverse effects from your medications to your primary care provider promptly.  In the event of recurrent symptoms or worsening symptoms, call 911, a crisis hotline, or go to the nearest emergency department for evaluation.   Total Discharge Time:  Greater than 30 minutes.  Signed: Beau Fanny, FNP-BC 02/17/2015, 11:12 AM  I personally assessed the patient and formulated the plan Madie Reno A. Dub Mikes, M.D.

## 2015-02-17 NOTE — Progress Notes (Signed)
Patient ID: Elizabeth [name], female   DOB: Apr 23, 1991, 24 y.o.   MRN: [id number]  Pt was discharged home with her boyfriend, she reported being ready for discharge. Pt was given her discharge instruction and sample medication. Pt reported being negative SI/HI, no AH/VH noted. Pt reported no issues or concerns.
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
## 2015-02-20 ENCOUNTER — Telehealth (HOSPITAL_COMMUNITY): Payer: Self-pay | Admitting: Licensed Clinical Social Worker

## 2015-02-28 ENCOUNTER — Telehealth (HOSPITAL_COMMUNITY): Payer: Self-pay | Admitting: Licensed Clinical Social Worker

## 2015-03-01 ENCOUNTER — Other Ambulatory Visit (HOSPITAL_COMMUNITY): Payer: 59 | Attending: Psychiatry | Admitting: Licensed Clinical Social Worker

## 2015-03-01 DIAGNOSIS — F410 Panic disorder [episodic paroxysmal anxiety] without agoraphobia: Secondary | ICD-10-CM | POA: Insufficient documentation

## 2015-03-01 DIAGNOSIS — F4310 Post-traumatic stress disorder, unspecified: Secondary | ICD-10-CM | POA: Insufficient documentation

## 2015-03-01 DIAGNOSIS — F3164 Bipolar disorder, current episode mixed, severe, with psychotic features: Secondary | ICD-10-CM | POA: Insufficient documentation

## 2015-03-02 ENCOUNTER — Other Ambulatory Visit (HOSPITAL_COMMUNITY): Payer: Self-pay

## 2015-03-02 ENCOUNTER — Encounter (HOSPITAL_COMMUNITY): Payer: Self-pay | Admitting: Licensed Clinical Social Worker

## 2015-03-02 ENCOUNTER — Telehealth (HOSPITAL_COMMUNITY): Payer: Self-pay | Admitting: Psychiatry

## 2015-03-02 ENCOUNTER — Other Ambulatory Visit (HOSPITAL_COMMUNITY): Payer: 59

## 2015-03-02 DIAGNOSIS — F4310 Post-traumatic stress disorder, unspecified: Secondary | ICD-10-CM | POA: Diagnosis not present

## 2015-03-02 DIAGNOSIS — F3164 Bipolar disorder, current episode mixed, severe, with psychotic features: Secondary | ICD-10-CM | POA: Diagnosis not present

## 2015-03-02 DIAGNOSIS — F410 Panic disorder [episodic paroxysmal anxiety] without agoraphobia: Secondary | ICD-10-CM | POA: Diagnosis not present

## 2015-03-02 NOTE — Telephone Encounter (Signed)
D:  [facility] in order to verify MH benefits for IOP.  Pt is responsible for $25.00 copay each day she attends MH-IOP.  A:  Placed call to pt, left vm re: copay.  Extended the offer of starting MH-IOP on 03-05-15 @ 8:45 a.m.  Encouraged pt to call writer to confirm.  Also, mentioned that if the cost is still too much; Clinical research associatewriter had another referral for her which is free.  Informed Dr. Ladona Ridgelaylor, Everlene BallsShawn Taylor, RN and Dr. Jama Flavorsobos.  Will await a phone call from pt.

## 2015-03-02 NOTE — Telephone Encounter (Signed)
D:  Counselor Garment/textile technologist) from [facility] referred pt to [facility] today due to insurance purposes.  According to Levada Dy [facility] will only cover at 60%, whereas [employer] will cover at 100% for MH-IOP.  Dr. Lovena Le and myself met with pt; during orientation pt voiced concerns about finances.  Informed pt that she could call [employer] to verify her benefits for herself.  Offered to place pt in a separate office so she could make the call, but pt stated that she would prefer to go outside to make the call.  Whenever pt went outside to check on pt; she had left.  Informed Dr. Lovena Le and Beather Arbour (clinical mgr).  Placed call to patient, she was tearful and stated that she can't be helped.  Inquired if she made the call to [facility], and she hung up on this Probation officer.  A:  Will call [employer] to verify patients MH benefits for a IOP.  Will then call pt to inform her and offer the start date of 03-05-15 if she would like to start.  If finances are still an issue; will offer [facility] which is free.  Will inform Dr. Parke Poisson about pt.
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
## 2015-03-05 ENCOUNTER — Telehealth (HOSPITAL_COMMUNITY): Payer: Self-pay | Admitting: Licensed Clinical Social Worker

## 2015-03-05 ENCOUNTER — Ambulatory Visit (HOSPITAL_COMMUNITY): Payer: Self-pay | Admitting: Psychology

## 2015-03-05 ENCOUNTER — Other Ambulatory Visit (HOSPITAL_COMMUNITY): Payer: 59

## 2015-03-05 NOTE — Telephone Encounter (Signed)
Tc- to [facility]- [facility] ( All 4 therapists- no answer)   TC- TTS- ACT Team numbers for referral  TC- [facility] [phone]  Safety Agreement- Referral for ACTT - [facility]  Appointment made for [facility] Monday, March 12, 2015@10am 

## 2015-03-06 ENCOUNTER — Other Ambulatory Visit (HOSPITAL_COMMUNITY): Payer: Self-pay

## 2015-03-07 ENCOUNTER — Other Ambulatory Visit (HOSPITAL_COMMUNITY): Payer: Self-pay

## 2015-03-08 ENCOUNTER — Other Ambulatory Visit (HOSPITAL_COMMUNITY): Payer: Self-pay

## 2015-03-09 ENCOUNTER — Other Ambulatory Visit (HOSPITAL_COMMUNITY): Payer: Self-pay

## 2015-03-12 ENCOUNTER — Other Ambulatory Visit (HOSPITAL_COMMUNITY): Payer: Self-pay

## 2015-03-13 ENCOUNTER — Other Ambulatory Visit (HOSPITAL_COMMUNITY): Payer: Self-pay

## 2015-03-14 ENCOUNTER — Other Ambulatory Visit (HOSPITAL_COMMUNITY): Payer: Self-pay

## 2015-03-15 ENCOUNTER — Other Ambulatory Visit (HOSPITAL_COMMUNITY): Payer: Self-pay

## 2015-03-16 ENCOUNTER — Other Ambulatory Visit (HOSPITAL_COMMUNITY): Payer: Self-pay

## 2015-03-19 ENCOUNTER — Other Ambulatory Visit (HOSPITAL_COMMUNITY): Payer: Self-pay

## 2015-03-20 ENCOUNTER — Other Ambulatory Visit (HOSPITAL_COMMUNITY): Payer: Self-pay

## 2015-03-21 ENCOUNTER — Other Ambulatory Visit (HOSPITAL_COMMUNITY): Payer: Self-pay

## 2015-03-22 ENCOUNTER — Other Ambulatory Visit (HOSPITAL_COMMUNITY): Payer: Self-pay

## 2015-03-23 ENCOUNTER — Other Ambulatory Visit (HOSPITAL_COMMUNITY): Payer: Self-pay

## 2015-03-26 ENCOUNTER — Other Ambulatory Visit (HOSPITAL_COMMUNITY): Payer: Self-pay

## 2015-03-27 ENCOUNTER — Other Ambulatory Visit (HOSPITAL_COMMUNITY): Payer: Self-pay

## 2015-03-28 ENCOUNTER — Other Ambulatory Visit (HOSPITAL_COMMUNITY): Payer: Self-pay

## 2015-03-29 ENCOUNTER — Other Ambulatory Visit (HOSPITAL_COMMUNITY): Payer: Self-pay

## 2015-03-30 ENCOUNTER — Other Ambulatory Visit (HOSPITAL_COMMUNITY): Payer: Self-pay

## 2015-04-02 ENCOUNTER — Other Ambulatory Visit (HOSPITAL_COMMUNITY): Payer: Self-pay

## 2015-04-03 ENCOUNTER — Other Ambulatory Visit (HOSPITAL_COMMUNITY): Payer: Self-pay

## 2015-04-04 ENCOUNTER — Other Ambulatory Visit (HOSPITAL_COMMUNITY): Payer: Self-pay

## 2015-06-11 IMAGING — CR DG ABDOMEN ACUTE W/ 1V CHEST
3 series · 3 of 3 positions shown · non-contrast
Comparison: Flat erect abdomen 04/21/2014. Chest radiograph
11/03/2014.

CLINICAL DATA: Nausea, vomiting, and constipation. Symptoms for a
few days, worse today.

EXAM:
DG ABDOMEN ACUTE W/ 1V CHEST

[w chest pa]
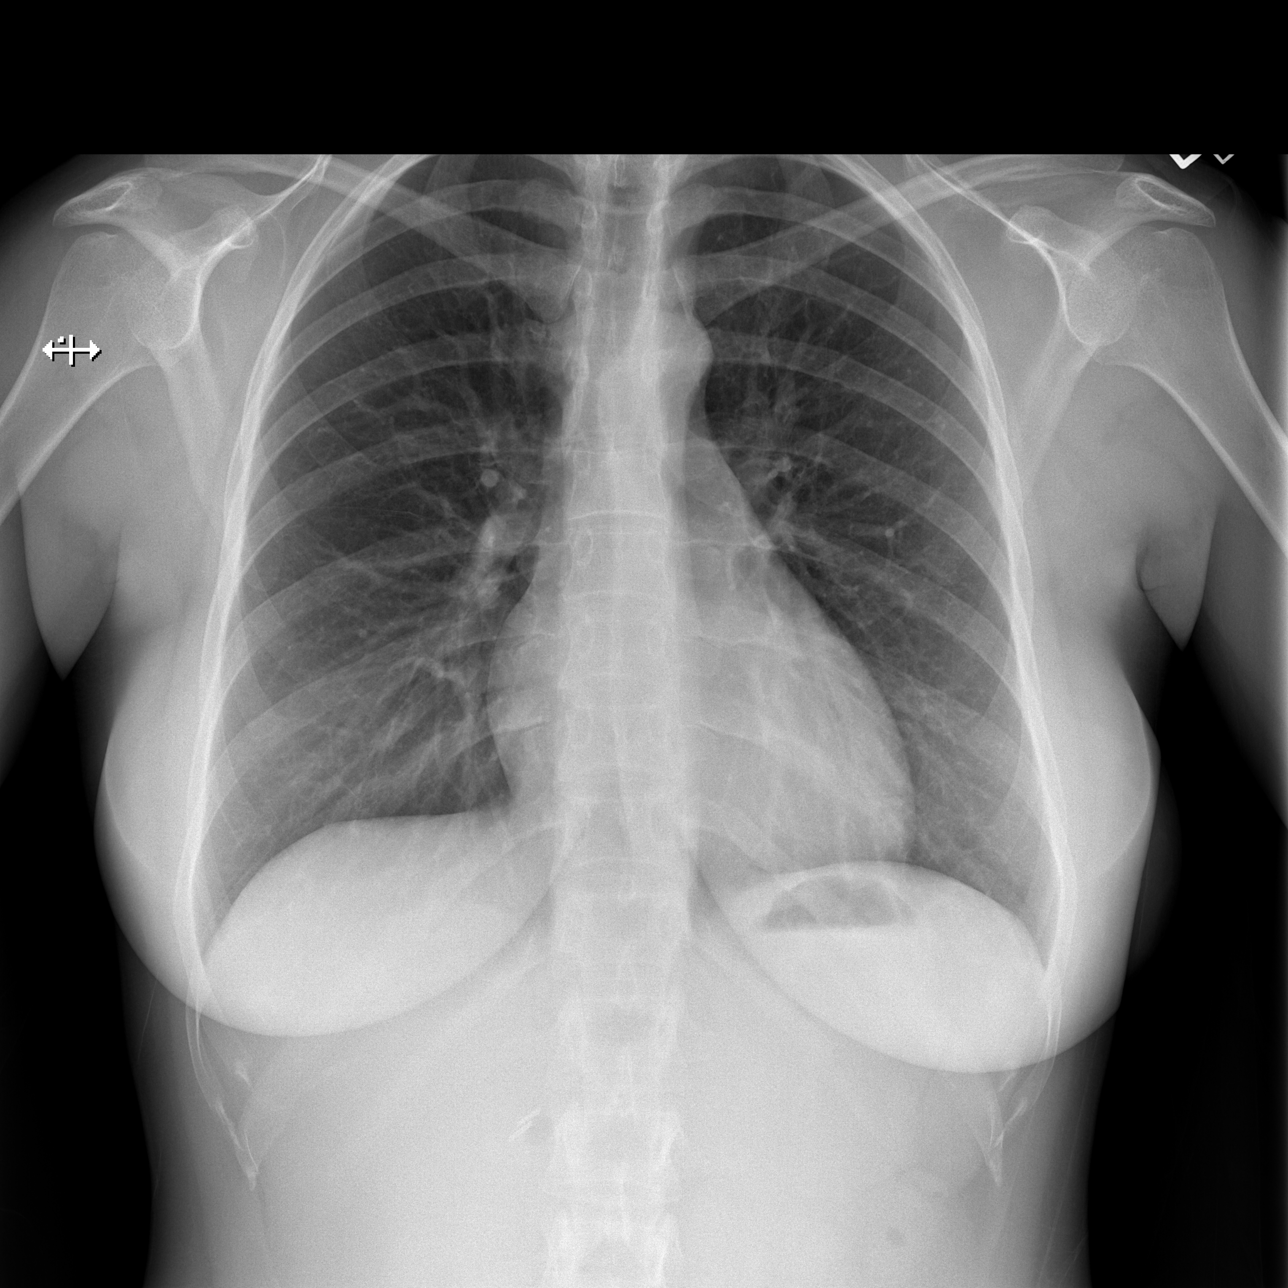

[w abdomen upright]
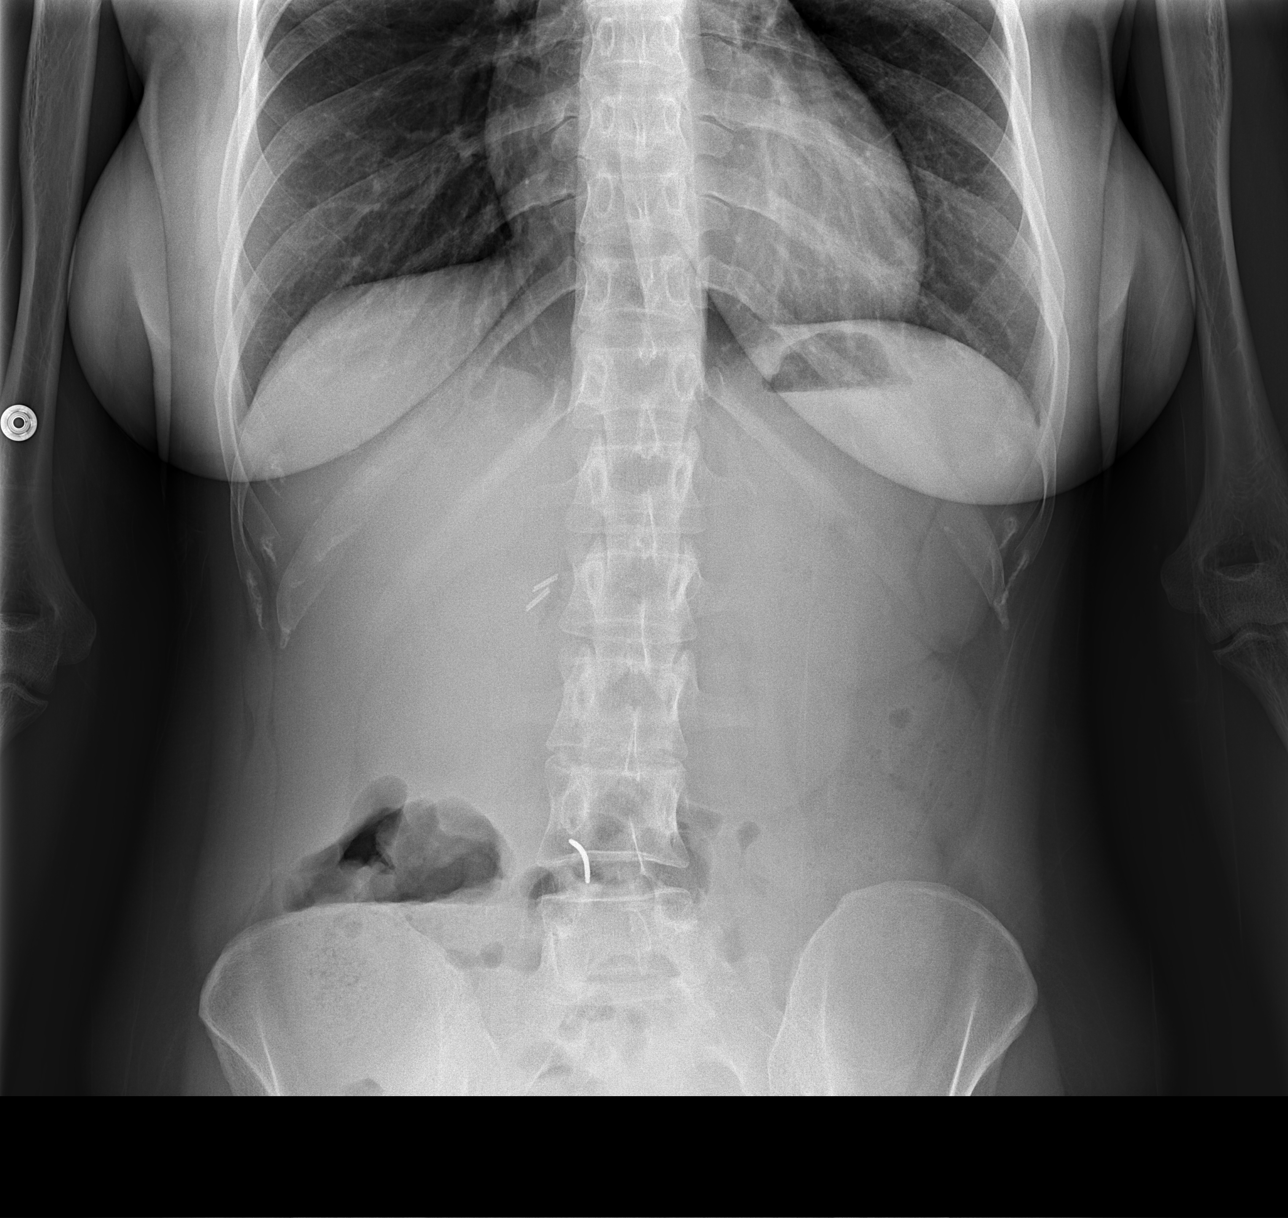

[t abdomen supine]
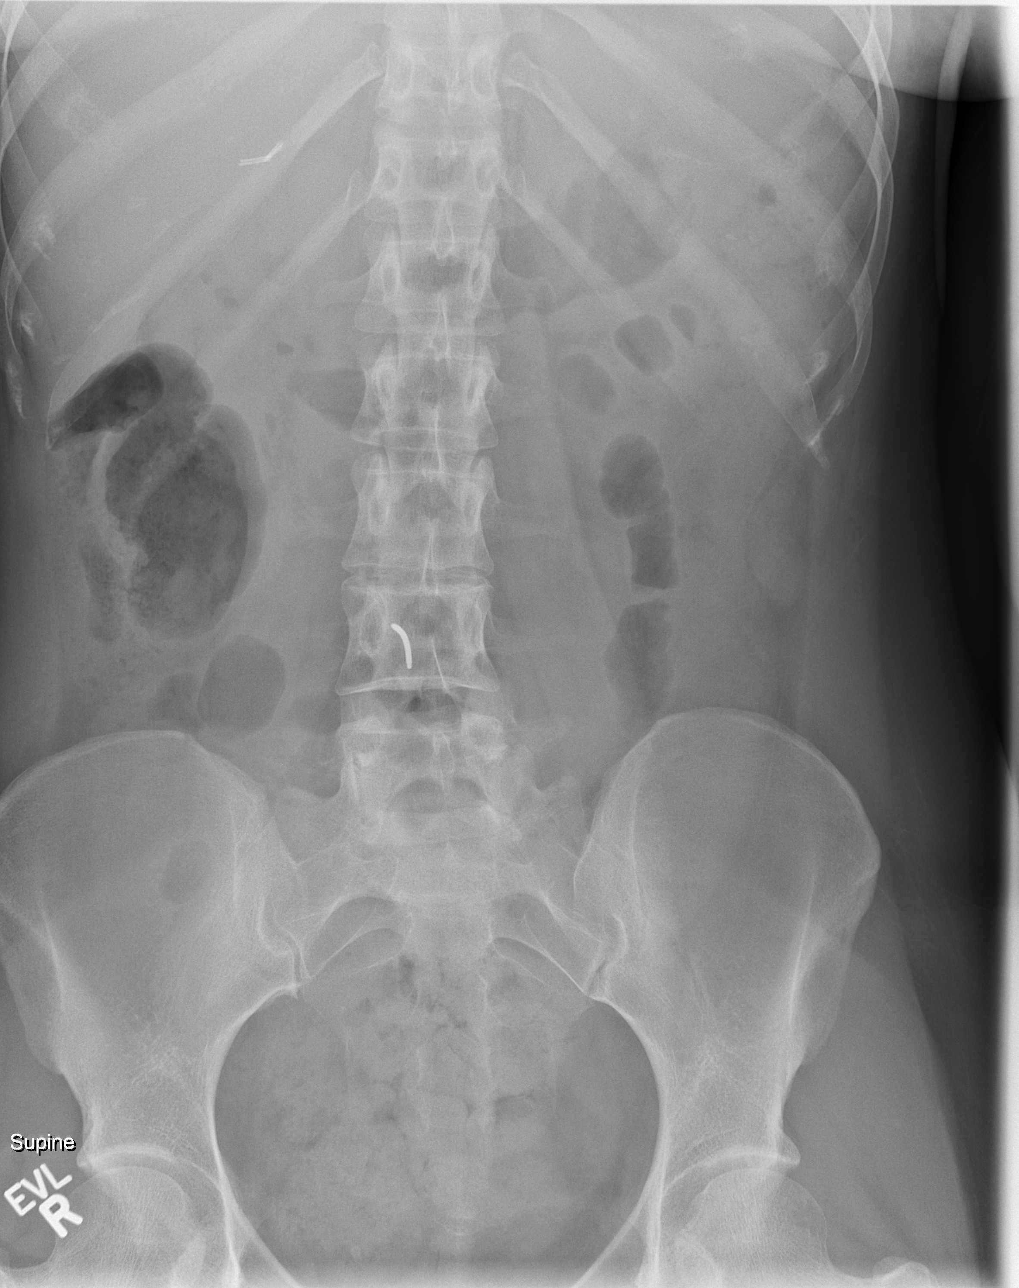

[3 of 3 positions shown; findings below may reference images not displayed]

FINDINGS: There is no evidence of dilated bowel loops or free intraperitoneal
air. No radiopaque calculi or other significant radiographic
abnormality is seen. Heart size and mediastinal contours are within
normal limits. Both lungs are clear. Sequelae of prior
cholecystectomy.
IMPRESSION: Negative abdominal radiographs.  No acute cardiopulmonary disease.

## 2015-06-11 IMAGING — CT CT ABD-PELV W/ CM
1 of 2 series · 15 of 32 positions shown, 19 images · IV contrast (OMNIPAQUE 300)
Comparison: Pelvic ultrasound performed 10/26/2012, and abdominal
radiograph performed earlier today at [DATE] p.m.

CLINICAL DATA: Acute onset of severe lower abdominal pain and
nausea. Initial encounter.

EXAM:
CT ABDOMEN AND PELVIS WITH CONTRAST
TECHNIQUE: Multidetector CT imaging of the abdomen and pelvis was performed
using the standard protocol following bolus administration of
intravenous contrast.
CONTRAST:  50mL OMNIPAQUE IOHEXOL 300 MG/ML SOLN, 100mL OMNIPAQUE
IOHEXOL 300 MG/ML SOLN

[Series 2: abd/pel with · axial · 0.74mm/px · z∈[-590,-140]mm · 15 of 98 slices shown, 19 images]
[im 4/98  soft-tissue]
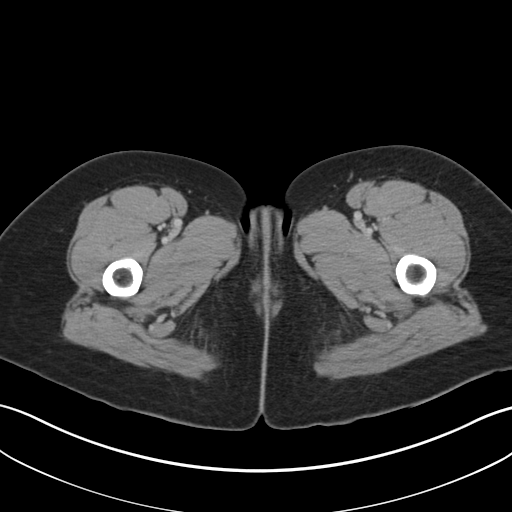
[im 4/98  bone]
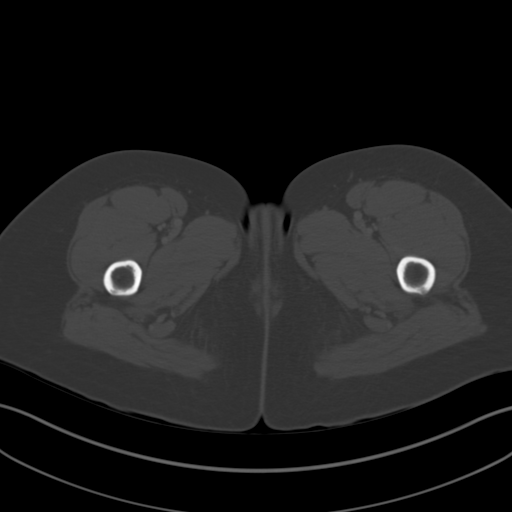
[im 12/98  soft-tissue]
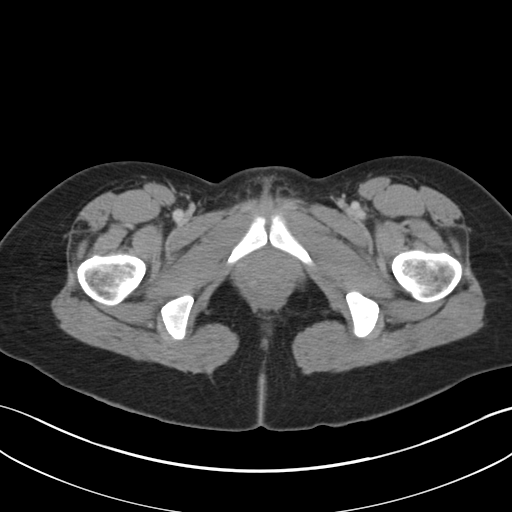
[im 20/98  soft-tissue]
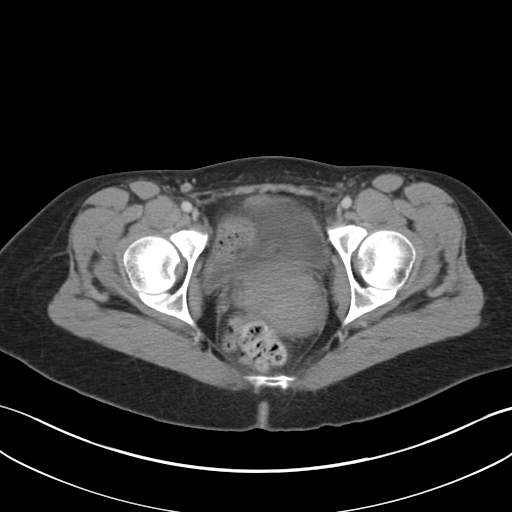
[im 28/98  soft-tissue]
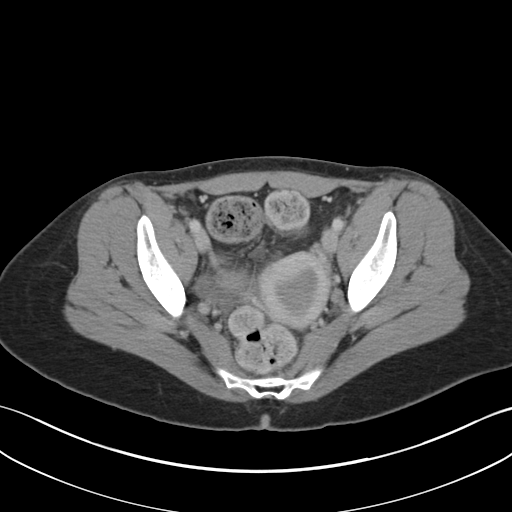
[im 35/98  soft-tissue]
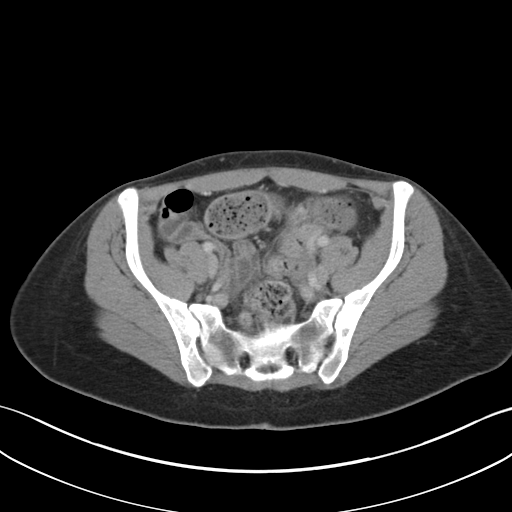
[im 43/98  soft-tissue]
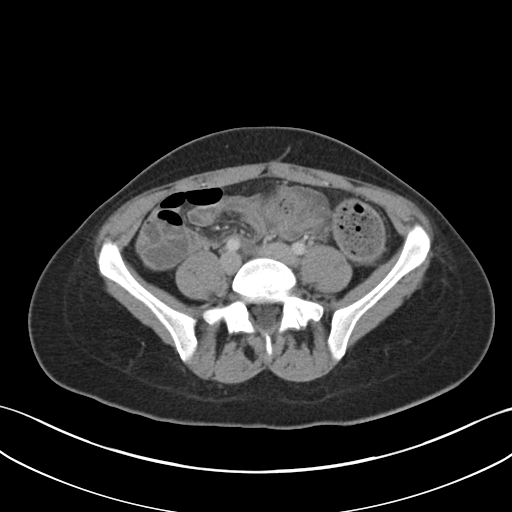
[im 51/98  soft-tissue]
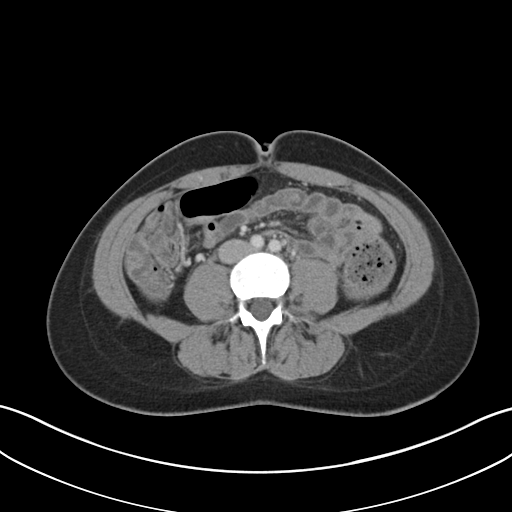
[im 55/98  soft-tissue]
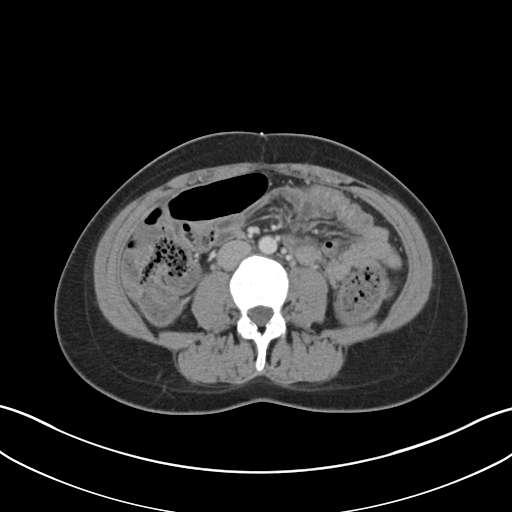
[im 63/98  soft-tissue]
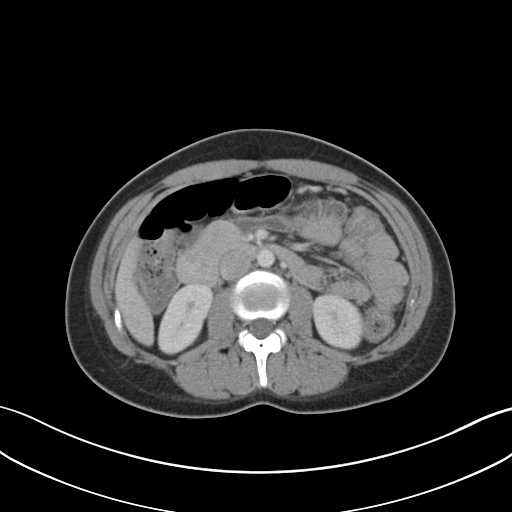
[im 63/98  bone]
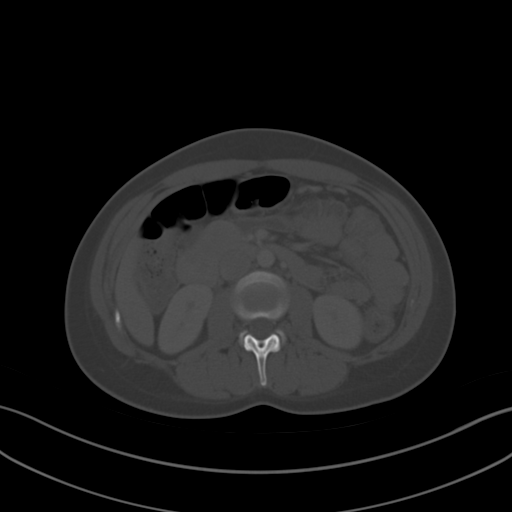
[im 70/98  soft-tissue]
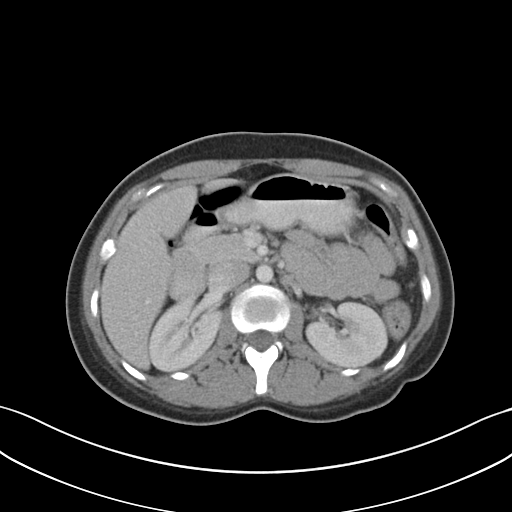
[im 78/98  soft-tissue]
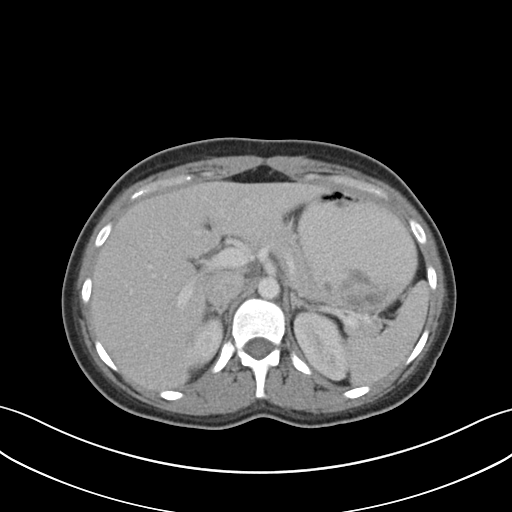
[im 82/98  lung]
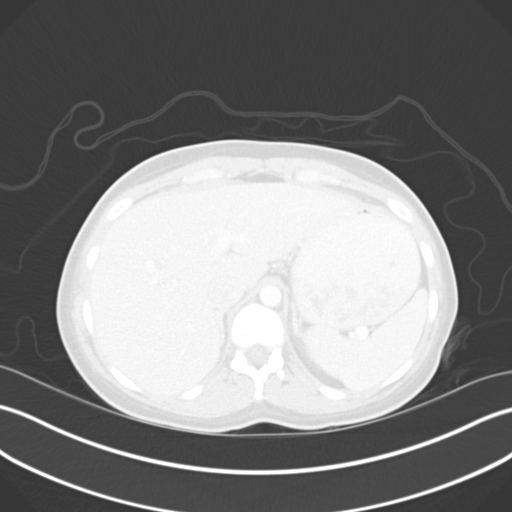
[im 86/98  soft-tissue]
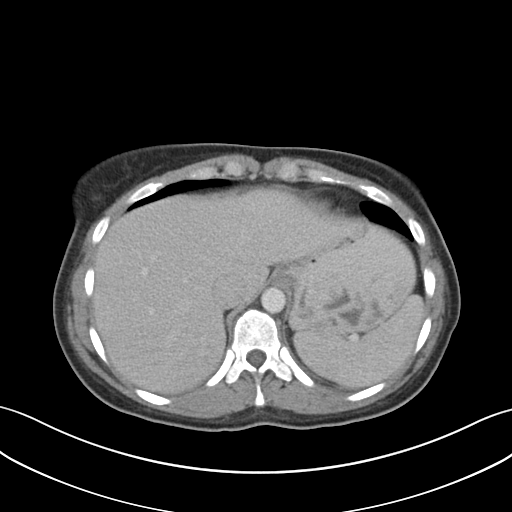
[im 86/98  lung]
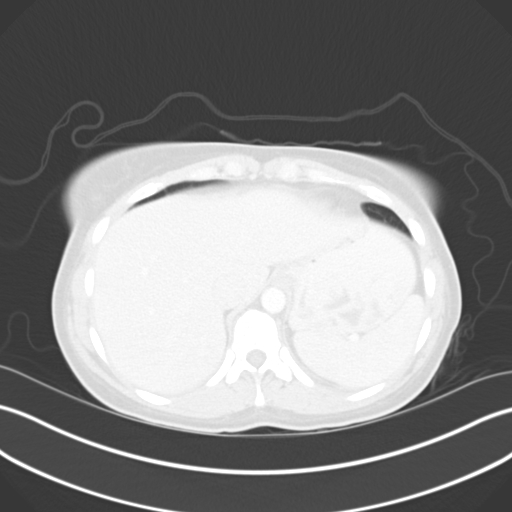
[im 90/98  lung]
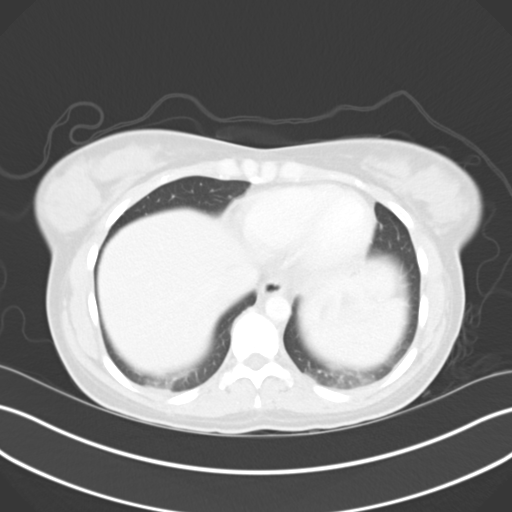
[im 94/98  soft-tissue]
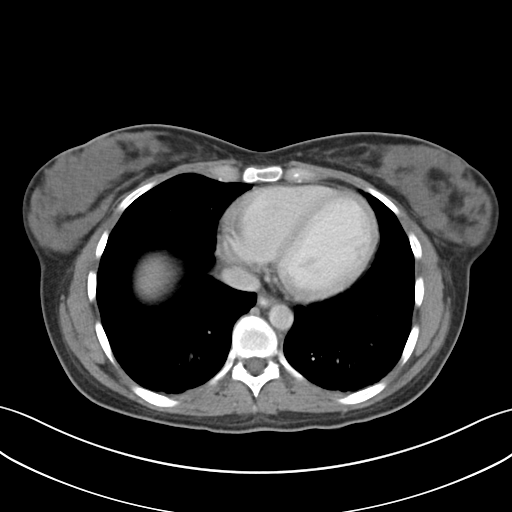
[im 94/98  lung]
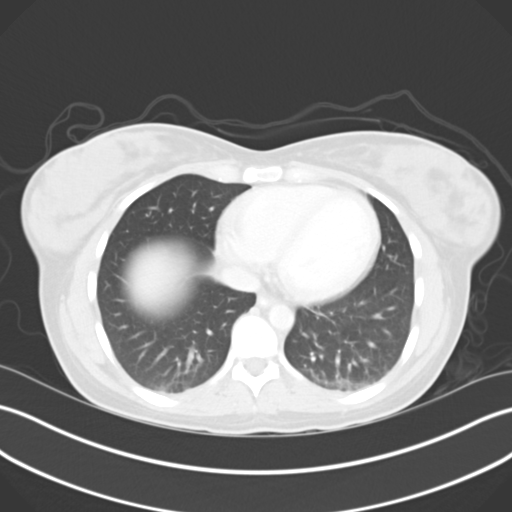

[15 of 32 positions shown; findings below may reference images not displayed]

FINDINGS: Mild bibasilar atelectasis is noted.

The liver and spleen are unremarkable in appearance. The patient is
status post cholecystectomy, with clips noted at the gallbladder
fossa. The pancreas and adrenal glands are unremarkable.

The kidneys are unremarkable in appearance. There is no evidence of
hydronephrosis. No renal or ureteral stones are seen. No perinephric
stranding is appreciated.

No free fluid is identified. The small bowel is unremarkable in
appearance. The stomach is within normal limits. No acute vascular
abnormalities are seen.

The appendix is not definitely seen; there is no evidence of
appendicitis. The colon is partially filled with stool, particularly
at the sigmoid colon, and is unremarkable in appearance.

A metallic piercing is noted at the umbilicus.

The bladder is relatively decompressed and not well assessed. The
uterus is unremarkable in appearance. The ovaries are relatively
symmetric. No suspicious adnexal masses are seen. No inguinal
lymphadenopathy is seen.

No acute osseous abnormalities are identified.
IMPRESSION: 1. No acute abnormality seen within the abdomen and pelvis
2. Sigmoid colon largely filled with stool, which may reflect mild
constipation, though the remainder of the colon is unremarkable.
3. Mild bibasilar atelectasis noted.

## 2015-08-29 ENCOUNTER — Ambulatory Visit (INDEPENDENT_AMBULATORY_CARE_PROVIDER_SITE_OTHER): Payer: 59

## 2015-08-29 ENCOUNTER — Ambulatory Visit (INDEPENDENT_AMBULATORY_CARE_PROVIDER_SITE_OTHER): Payer: 59 | Admitting: Family Medicine

## 2015-08-29 VITALS — BP 118/68 | HR 83 | Temp 98.2°F | Resp 17 | Ht 65.5 in | Wt 141.0 lb

## 2015-08-29 DIAGNOSIS — M5136 Other intervertebral disc degeneration, lumbar region: Secondary | ICD-10-CM | POA: Diagnosis not present

## 2015-08-29 DIAGNOSIS — Z3041 Encounter for surveillance of contraceptive pills: Secondary | ICD-10-CM | POA: Diagnosis not present

## 2015-08-29 DIAGNOSIS — Z23 Encounter for immunization: Secondary | ICD-10-CM

## 2015-08-29 DIAGNOSIS — M5442 Lumbago with sciatica, left side: Secondary | ICD-10-CM

## 2015-08-29 DIAGNOSIS — M5441 Lumbago with sciatica, right side: Secondary | ICD-10-CM

## 2015-08-29 MED ORDER — CYCLOBENZAPRINE HCL 5 MG PO TABS: 5.0000 mg | ORAL_TABLET | Freq: Three times a day (TID) | ORAL | Status: AC | PRN

## 2015-08-29 MED ORDER — TRAMADOL-ACETAMINOPHEN 37.5-325 MG PO TABS: 1.0000 | ORAL_TABLET | Freq: Four times a day (QID) | ORAL | Status: AC | PRN

## 2015-08-29 MED ORDER — NORGESTIMATE-ETH ESTRADIOL 0.25-35 MG-MCG PO TABS: 1.0000 | ORAL_TABLET | Freq: Every day | ORAL | Status: AC

## 2015-08-29 NOTE — Progress Notes (Signed)
[facility] [address] [place] [address] [phone]  Date:  08/29/2015   Name:  Elizabeth Little   DOB:  [birth year]   MRN:  [id number]  PCP:  No PCP Per Patient    Chief Complaint: Back Pain and Immunizations   History of Present Illness:  This is a 25 y.o. female with PMH bipolar I disorder, history suicide attempt, hx CRPS who is presenting with back pain x 3 days. States she woke with lower back pain 3 days ago. NKI. She had gone on a 1/2 mile walk in the snow the day before. She had intercourse before going to bed (nothing out of the ordinary). Pain staying the same since onset. Sitting and laying down very uncomfortable. Standing gives her the most relief. Having radiation of pain into bilateral posterior legs to level of mid-thigh.  Paresthesias: no Weakness: yes, states both of her legs feel weak and like her knees are going to give out Problems with bowel or bladder: no incontinence. States very painful to have BM though Previous back injury: no When she was 16 she had complex regional pain syndrome of her right leg. Had an epidural for 2 months for treatment. Has been in remission since.  Has tried aspirin, tylenol, ibuprofen - nothing has helped.  She is followed by neuropsychiatric care center for her bipolar disorder - they prescribe lithium, klonopin and latuda. She is out of her OCP - her and her boyfriend have been using condoms diligently since running out.  Review of Systems:  Review of Systems See HPI  Patient Active Problem List   Diagnosis Date Noted  . Suicide attempt (HCC) 02/11/2015  . Intentional drug overdose (HCC) 02/11/2015  . Adverse reaction to hormonal drug 12/04/2014  . Complex regional pain syndrome of lower limb 12/04/2014  . Ovarian cyst 12/01/2014  . Anxiety 12/01/2014  . Bipolar I disorder, most recent episode depressed (HCC)   . PTSD (post-traumatic stress disorder) 07/25/2014  . Bipolar disorder, current  episode mixed, severe, with psychotic features (HCC)   . Asthma 11/11/2012  . Dysmenorrhea 11/11/2012    Prior to Admission medications   Medication Sig Start Date End Date Taking? Authorizing Provider  albuterol (PROVENTIL HFA;VENTOLIN HFA) 108 (90 BASE) MCG/ACT inhaler Inhale 2 puffs into the lungs every 6 (six) hours as needed for wheezing. 12/01/14  Yes Sherren Mocha, MD  lurasidone (LATUDA) 40 MG TABS tablet Take 1 tablet (40 mg total) by mouth daily with breakfast. Patient taking differently: Take 40 mg by mouth 1 day or 1 dose. Patient is now taking at night 02/17/15  Yes Beau Fanny, FNP  ondansetron (ZOFRAN-ODT) 4 MG disintegrating tablet Take 1 tablet (4 mg total) by mouth every 8 (eight) hours as needed for nausea or vomiting. 02/17/15  Yes Beau Fanny, FNP  lithium carbonate 300 MG capsule Take 1 capsule (300 mg total) by mouth 2 (two) times daily with a meal. Patient not taking: Reported on 08/29/2015 02/17/15  yes Beau Fanny, FNP                  Allergies  Allergen Reactions  . Mint Chocolate Chip Flavor [Flavoring Agent] Swelling and Rash    Pt reports "I am allergic to mint. I get a rash and swollen lips. I need an epi-pen if I eat mint."  . Subutex [Buprenorphine] Shortness Of Breath    "Was unable to breath on my own"  . Dilaudid [Hydromorphone  Hcl] Itching    Itches until she bleeds    Past Surgical History  Procedure Laterality Date  . Cholecystectomy      Social History  Substance Use Topics  . Smoking status: Former Games developer  . Smokeless tobacco: Never Used  . Alcohol Use: No     Comment: Rarely, Less than monthly    Family History  Problem Relation Age of Onset  . Mental illness Mother   . Mental illness Father   . Mental illness Brother   . Asthma Brother     Medication list has been reviewed and updated.  Physical Examination:  Physical Exam  Constitutional: She is oriented to person, place, and time. She appears well-developed and  well-nourished. No distress.  HENT:  Head: Normocephalic and atraumatic.  Right Ear: Hearing normal.  Left Ear: Hearing normal.  Nose: Nose normal.  Eyes: Conjunctivae and lids are normal. Right eye exhibits no discharge. Left eye exhibits no discharge. No scleral icterus.  Cardiovascular: Normal rate, regular rhythm, normal heart sounds and normal pulses.   No murmur heard. Pulmonary/Chest: Effort normal and breath sounds normal. No respiratory distress. She has no wheezes. She has no rhonchi. She has no rales.  Musculoskeletal:       Lumbar back: She exhibits decreased range of motion (d/t pain), tenderness (midline, left and right paraspinal) and bony tenderness (midline lumbar and coccyx). She exhibits no swelling.  Straight leg raise negative  Neurological: She is alert and oriented to person, place, and time. She has normal strength and normal reflexes. No sensory deficit. Gait normal.  Skin: Skin is warm, dry and intact. No lesion and no rash noted.  Psychiatric: She has a normal mood and affect. Her speech is normal and behavior is normal. Thought content normal.   BP 118/68 mmHg  Pulse 83  Temp(Src) 98.2 F (36.8 C) (Oral)  Resp 17  Ht 5' 5.5" (1.664 m)  Wt 141 lb (63.957 kg)  BMI 23.10 kg/m2  SpO2 97%  LMP 08/07/2015  [facility] reading (PRIMARY) by  Dr. Clelia Croft: degenerative disc disease at L3-4.   Assessment and Plan:  1. Bilateral low back pain with sciatica, sciatica laterality unspecified 2. DDD, lumbar Radiographs showing DDD of L3-4, possibly where epidural was placed at age 66. Possible that this is a muscle strain or pain is coming from DDD. Will refer to ortho for further eval. Treat with flexeril and ultracet. Staying away from NSAIDs since can increase lithium levels. Pt states she has had tramadol before and did ok with it. Staying away from prednisone d/t possibility of inducing psychosis. F/u with ortho or return if needed - return precautions discussed. - DG  Sacrum/Coccyx; Future - DG Lumbar Spine Complete; Future - cyclobenzaprine (FLEXERIL) 5 MG tablet; Take 1 tablet (5 mg total) by mouth 3 (three) times daily as needed for muscle spasms.  Dispense: 30 tablet; Refill: 0 - Ambulatory referral to Orthopedic Surgery - traMADol-acetaminophen (ULTRACET) 37.5-325 MG tablet; Take 1 tablet by mouth every 6 (six) hours as needed.  Dispense: 30 tablet; Refill: 0 - Ambulatory referral to Orthopedic Surgery  3. Need for prophylactic vaccination and inoculation against influenza - Flu Vaccine QUAD 36+ mos IM  4. Surveillance for birth control, oral contraceptives Refilled birth control. Stressed the importance of not running out, esp with being on lithium. - norgestimate-ethinyl estradiol (ORTHO-CYCLEN,SPRINTEC,PREVIFEM) 0.25-35 MG-MCG tablet; Take 1 tablet by mouth daily.  Dispense: 1 Package; Refill: 11   Mang Hazelrigg V. Dyke Brackett, MHS [facility]  08/29/2015

## 2015-08-29 NOTE — Progress Notes (Signed)
Patient ID: Elizabeth [name], female   DOB: 1990-11-07, 25 y.o.   MRN: [id number] Reviewed documentation and xray and agree w/ assessment and plan. Norberto SorensonEva Shaw, MD MPH

## 2015-08-29 NOTE — Patient Instructions (Addendum)
Use tramadol twice a day as needed for pain. If any side effects, stop the medicine and call our office. Take flexeril three times a day as able to tolerate. If makes too drowsy, just take at night Heat, gentle massage and gentle stretching can help. Remain active, as inactivity can cause more pain. Don't do anything too strenuous though. If you develop new numbness, weakness or incontinence of urine or bowel, return to clinic or go to the emergency room ASAP. You will get phone call to make appt with ortho Return if your symptoms worsen in the meantime.   Influenza Virus Vaccine injection (Fluarix) What is this medicine? INFLUENZA VIRUS VACCINE (in floo EN zuh VAHY ruhs vak SEEN) helps to reduce the risk of getting influenza also known as the flu. This medicine may be used for other purposes; ask your health care provider or pharmacist if you have questions. What should I tell my health care provider before I take this medicine? They need to know if you have any of these conditions: -bleeding disorder like hemophilia -fever or infection -Guillain-Barre syndrome or other neurological problems -immune system problems -infection with the human immunodeficiency virus (HIV) or AIDS -low blood platelet counts -multiple sclerosis -an unusual or allergic reaction to influenza virus vaccine, eggs, chicken proteins, latex, gentamicin, other medicines, foods, dyes or preservatives -pregnant or trying to get pregnant -breast-feeding How should I use this medicine? This vaccine is for injection into a muscle. It is given by a health care professional. A copy of Vaccine Information Statements will be given before each vaccination. Read this sheet carefully each time. The sheet may change frequently. Talk to your pediatrician regarding the use of this medicine in children. Special care may be needed. Overdosage: If you think you have taken too much of this medicine contact a poison control center or  emergency room at once. NOTE: This medicine is only for you. Do not share this medicine with others. What if I miss a dose? This does not apply. What may interact with this medicine? -chemotherapy or radiation therapy -medicines that lower your immune system like etanercept, anakinra, infliximab, and adalimumab -medicines that treat or prevent blood clots like warfarin -phenytoin -steroid medicines like prednisone or cortisone -theophylline -vaccines This list may not describe all possible interactions. Give your health care provider a list of all the medicines, herbs, non-prescription drugs, or dietary supplements you use. Also tell them if you smoke, drink alcohol, or use illegal drugs. Some items may interact with your medicine. What should I watch for while using this medicine? Report any side effects that do not go away within 3 days to your doctor or health care professional. Call your health care provider if any unusual symptoms occur within 6 weeks of receiving this vaccine. You may still catch the flu, but the illness is not usually as bad. You cannot get the flu from the vaccine. The vaccine will not protect against colds or other illnesses that may cause fever. The vaccine is needed every year. What side effects may I notice from receiving this medicine? Side effects that you should report to your doctor or health care professional as soon as possible: -allergic reactions like skin rash, itching or hives, swelling of the face, lips, or tongue Side effects that usually do not require medical attention (report to your doctor or health care professional if they continue or are bothersome): -fever -headache -muscle aches and pains -pain, tenderness, redness, or swelling at site where injected -weak or  tired This list may not describe all possible side effects. Call your doctor for medical advice about side effects. You may report side effects to FDA at 1-800-FDA-1088. Where should I  keep my medicine? This vaccine is only given in a clinic, pharmacy, doctor's office, or other health care setting and will not be stored at home. NOTE: This sheet is a summary. It may not cover all possible information. If you have questions about this medicine, talk to your doctor, pharmacist, or health care provider.    2016, [employer]. (2008-03-01 09:30:40)
